# Patient Record
Sex: Female | Born: 1985 | Race: Black or African American | Hispanic: No | Marital: Married | Smoking: Never smoker
Health system: Southern US, Community
[De-identification: ages and names within clinical notes are randomized; demographics above are authoritative.]

## PROBLEM LIST (undated history)

## (undated) ENCOUNTER — Inpatient Hospital Stay: Payer: Self-pay

## (undated) DIAGNOSIS — F419 Anxiety disorder, unspecified: Secondary | ICD-10-CM

## (undated) DIAGNOSIS — F32A Depression, unspecified: Secondary | ICD-10-CM

## (undated) DIAGNOSIS — K5900 Constipation, unspecified: Secondary | ICD-10-CM

## (undated) DIAGNOSIS — R61 Generalized hyperhidrosis: Secondary | ICD-10-CM

## (undated) DIAGNOSIS — Z789 Other specified health status: Secondary | ICD-10-CM

## (undated) HISTORY — PX: NO PAST SURGERIES: SHX2092

## (undated) HISTORY — DX: Anxiety disorder, unspecified: F41.9

## (undated) HISTORY — DX: Constipation, unspecified: K59.00

## (undated) HISTORY — DX: Generalized hyperhidrosis: R61

---

## 2013-12-08 LAB — OB RESULTS CONSOLE RPR
RPR: NONREACTIVE
RPR: NONREACTIVE
RPR: NONREACTIVE

## 2013-12-08 LAB — OB RESULTS CONSOLE ABO/RH: RH TYPE: POSITIVE

## 2013-12-08 LAB — OB RESULTS CONSOLE HIV ANTIBODY (ROUTINE TESTING): HIV: NONREACTIVE

## 2013-12-08 LAB — OB RESULTS CONSOLE GC/CHLAMYDIA
Chlamydia: NEGATIVE
GC PROBE AMP, GENITAL: NEGATIVE

## 2013-12-08 LAB — OB RESULTS CONSOLE RUBELLA ANTIBODY, IGM: RUBELLA: IMMUNE

## 2013-12-08 LAB — OB RESULTS CONSOLE VARICELLA ZOSTER ANTIBODY, IGG: Varicella: IMMUNE

## 2013-12-08 LAB — OB RESULTS CONSOLE HEPATITIS B SURFACE ANTIGEN: HEP B S AG: NEGATIVE

## 2014-01-15 NOTE — L&D Delivery Note (Signed)
Delivery Note At  a viable female infant Vaginal, Spontaneous Delivery (Presentation: Left Occiput Anterior).  APGAR: 7, 9; weight 8 lb 4.9 oz (3768 g).   Placenta status: Intact, Spontaneous.  Cord: 3 vessels with the following complications: .  Cord pH: N/A  Anesthesia: Epidural  Episiotomy: None Lacerations: Vaginal Suture Repair: 2.0 vicryl Est. Blood Loss (mL): 600  Quick second stage. Meconium stained amniotic fluid noted after delivery of head. <30 second shoulder dystocia resolved with McRoberts and suprapubic pressure. Infant not initially vigorous, cord clamped and cut immediately, to warmer for assessment by nursery staff.    Baby's Name: Kelly Kelly  Mom to postpartum.  Baby to Couplet care / Skin to Skin.  Marta AntuBrothers, Dalani Mette 07/17/2014, 7:19 PM

## 2014-06-21 LAB — OB RESULTS CONSOLE GBS: GBS: POSITIVE

## 2014-07-17 ENCOUNTER — Encounter: Payer: Self-pay | Admitting: *Deleted

## 2014-07-17 ENCOUNTER — Inpatient Hospital Stay: Payer: Managed Care, Other (non HMO) | Admitting: Anesthesiology

## 2014-07-17 ENCOUNTER — Inpatient Hospital Stay
Admission: EM | Admit: 2014-07-17 | Discharge: 2014-07-19 | DRG: 775 | Disposition: A | Discharge status: Home / Self Care | Payer: Managed Care, Other (non HMO) | Attending: Obstetrics and Gynecology | Admitting: Obstetrics and Gynecology

## 2014-07-17 DIAGNOSIS — O99824 Streptococcus B carrier state complicating childbirth: Secondary | ICD-10-CM | POA: Diagnosis present

## 2014-07-17 DIAGNOSIS — Z3A39 39 weeks gestation of pregnancy: Secondary | ICD-10-CM | POA: Diagnosis present

## 2014-07-17 HISTORY — DX: Other specified health status: Z78.9

## 2014-07-17 LAB — COMPREHENSIVE METABOLIC PANEL
ALT: 14 U/L (ref 14–54)
AST: 23 U/L (ref 15–41)
Albumin: 3 g/dL — ABNORMAL LOW (ref 3.5–5.0)
Alkaline Phosphatase: 199 U/L — ABNORMAL HIGH (ref 38–126)
Anion gap: 8 (ref 5–15)
BUN: 12 mg/dL (ref 6–20)
CHLORIDE: 107 mmol/L (ref 101–111)
CO2: 20 mmol/L — ABNORMAL LOW (ref 22–32)
Calcium: 8.7 mg/dL — ABNORMAL LOW (ref 8.9–10.3)
Creatinine, Ser: 0.84 mg/dL (ref 0.44–1.00)
GFR calc Af Amer: >60 mL/min (ref >60)
GFR calc non Af Amer: >60 mL/min (ref >60)
GLUCOSE: 86 mg/dL (ref 65–99)
POTASSIUM: 4.1 mmol/L (ref 3.5–5.1)
Sodium: 135 mmol/L (ref 135–145)
Total Bilirubin: 0.3 mg/dL (ref 0.3–1.2)
Total Protein: 7.4 g/dL (ref 6.5–8.1)

## 2014-07-17 LAB — CBC
HEMATOCRIT: 40.8 % (ref 35.0–47.0)
HEMOGLOBIN: 13.3 g/dL (ref 12.0–16.0)
MCH: 30.3 pg (ref 26.0–34.0)
MCHC: 32.6 g/dL (ref 32.0–36.0)
MCV: 93 fL (ref 80.0–100.0)
PLATELETS: 228 10*3/uL (ref 150–440)
RBC: 4.39 MIL/uL (ref 3.80–5.20)
RDW: 13.9 % (ref 11.5–14.5)
WBC: 14.1 10*3/uL — ABNORMAL HIGH (ref 3.6–11.0)

## 2014-07-17 LAB — SAMPLE TO BLOOD BANK

## 2014-07-17 LAB — PROTEIN / CREATININE RATIO, URINE
Creatinine, Urine: 97 mg/dL
PROTEIN CREATININE RATIO: 0.16 mg/mg{creat} — AB (ref 0.00–0.15)
TOTAL PROTEIN, URINE: 16 mg/dL

## 2014-07-17 LAB — URIC ACID: URIC ACID, SERUM: 7.1 mg/dL — AB (ref 2.3–6.6)

## 2014-07-17 MED ORDER — ONDANSETRON HCL 4 MG PO TABS: 4.0000 mg | ORAL_TABLET | ORAL | Status: DC | PRN

## 2014-07-17 MED ORDER — ACETAMINOPHEN 325 MG PO TABS: 650.0000 mg | ORAL_TABLET | ORAL | Status: DC | PRN

## 2014-07-17 MED ORDER — ZOLPIDEM TARTRATE 5 MG PO TABS: 5.0000 mg | ORAL_TABLET | Freq: Every evening | ORAL | Status: DC | PRN

## 2014-07-17 MED ORDER — LACTATED RINGERS IV SOLN
INTRAVENOUS | Status: DC
  Administered 2014-07-17 (×2): via INTRAVENOUS

## 2014-07-17 MED ORDER — MISOPROSTOL 200 MCG PO TABS
ORAL_TABLET | ORAL | Status: AC
  Filled 2014-07-17: qty 4

## 2014-07-17 MED ORDER — EPHEDRINE 5 MG/ML INJ
10.0000 mg | INTRAVENOUS | Status: DC | PRN
  Filled 2014-07-17: qty 2

## 2014-07-17 MED ORDER — DIPHENHYDRAMINE HCL 50 MG/ML IJ SOLN: 12.5000 mg | INTRAMUSCULAR | Status: DC | PRN

## 2014-07-17 MED ORDER — OXYTOCIN 40 UNITS IN LACTATED RINGERS INFUSION - SIMPLE MED: 62.5000 mL/h | INTRAVENOUS | Status: DC

## 2014-07-17 MED ORDER — SODIUM CHLORIDE 0.9 % IV SOLN
2.0000 g | Freq: Once | INTRAVENOUS | Status: AC
  Administered 2014-07-17: 2 g via INTRAVENOUS

## 2014-07-17 MED ORDER — LANOLIN HYDROUS EX OINT: TOPICAL_OINTMENT | CUTANEOUS | Status: DC | PRN

## 2014-07-17 MED ORDER — FERROUS SULFATE 325 (65 FE) MG PO TABS
325.0000 mg | ORAL_TABLET | Freq: Every day | ORAL | Status: DC
  Administered 2014-07-18 – 2014-07-19 (×2): 325 mg via ORAL
  Filled 2014-07-17 (×2): qty 1

## 2014-07-17 MED ORDER — ONDANSETRON HCL 4 MG/2ML IJ SOLN
INTRAMUSCULAR | Status: AC
  Administered 2014-07-17: 4 mg via INTRAVENOUS
  Filled 2014-07-17: qty 2

## 2014-07-17 MED ORDER — DOCUSATE SODIUM 100 MG PO CAPS
100.0000 mg | ORAL_CAPSULE | Freq: Two times a day (BID) | ORAL | Status: DC
  Administered 2014-07-17 – 2014-07-19 (×4): 100 mg via ORAL
  Filled 2014-07-17 (×4): qty 1

## 2014-07-17 MED ORDER — IBUPROFEN 600 MG PO TABS
600.0000 mg | ORAL_TABLET | Freq: Four times a day (QID) | ORAL | Status: DC
  Administered 2014-07-17 – 2014-07-19 (×7): 600 mg via ORAL
  Filled 2014-07-17 (×7): qty 1

## 2014-07-17 MED ORDER — CALCIUM CARBONATE ANTACID 500 MG PO CHEW
CHEWABLE_TABLET | ORAL | Status: AC
  Filled 2014-07-17: qty 1

## 2014-07-17 MED ORDER — SODIUM CHLORIDE 0.9 % IV SOLN
1.0000 g | INTRAVENOUS | Status: DC
  Administered 2014-07-17 (×2): 1 g via INTRAVENOUS
  Filled 2014-07-17 (×4): qty 1000

## 2014-07-17 MED ORDER — LIDOCAINE HCL (PF) 1 % IJ SOLN
INTRAMUSCULAR | Status: AC
  Filled 2014-07-17: qty 30

## 2014-07-17 MED ORDER — DIBUCAINE 1 % RE OINT: 1.0000 "application " | TOPICAL_OINTMENT | RECTAL | Status: DC | PRN

## 2014-07-17 MED ORDER — FENTANYL 2.5 MCG/ML W/ROPIVACAINE 0.2% IN NS 100 ML EPIDURAL INFUSION (ARMC-ANES): 10.0000 mL/h | EPIDURAL | Status: DC

## 2014-07-17 MED ORDER — LACTATED RINGERS IV SOLN: INTRAVENOUS | Status: DC

## 2014-07-17 MED ORDER — BUPIVACAINE HCL (PF) 0.25 % IJ SOLN
INTRAMUSCULAR | Status: DC | PRN
  Administered 2014-07-17: 10 mL

## 2014-07-17 MED ORDER — OXYCODONE-ACETAMINOPHEN 5-325 MG PO TABS: 2.0000 | ORAL_TABLET | ORAL | Status: DC | PRN

## 2014-07-17 MED ORDER — SODIUM CHLORIDE 0.9 % IV SOLN
INTRAVENOUS | Status: AC
  Administered 2014-07-17: 1 g via INTRAVENOUS
  Filled 2014-07-17: qty 1000

## 2014-07-17 MED ORDER — LIDOCAINE-EPINEPHRINE (PF) 1.5 %-1:200000 IJ SOLN
INTRAMUSCULAR | Status: DC | PRN
  Administered 2014-07-17: 3 mL via PERINEURAL

## 2014-07-17 MED ORDER — AMMONIA AROMATIC IN INHA
RESPIRATORY_TRACT | Status: AC
  Filled 2014-07-17: qty 10

## 2014-07-17 MED ORDER — SIMETHICONE 80 MG PO CHEW: 80.0000 mg | CHEWABLE_TABLET | ORAL | Status: DC | PRN

## 2014-07-17 MED ORDER — OXYTOCIN 10 UNIT/ML IJ SOLN
INTRAMUSCULAR | Status: AC
  Filled 2014-07-17: qty 2

## 2014-07-17 MED ORDER — WITCH HAZEL-GLYCERIN EX PADS: 1.0000 "application " | MEDICATED_PAD | CUTANEOUS | Status: DC | PRN

## 2014-07-17 MED ORDER — LACTATED RINGERS IV SOLN
500.0000 mL | INTRAVENOUS | Status: DC | PRN
  Administered 2014-07-17: 500 mL via INTRAVENOUS

## 2014-07-17 MED ORDER — OXYCODONE-ACETAMINOPHEN 5-325 MG PO TABS: 1.0000 | ORAL_TABLET | ORAL | Status: DC | PRN

## 2014-07-17 MED ORDER — OXYTOCIN 40 UNITS IN LACTATED RINGERS INFUSION - SIMPLE MED
INTRAVENOUS | Status: AC
  Filled 2014-07-17: qty 1000

## 2014-07-17 MED ORDER — PHENYLEPHRINE 40 MCG/ML (10ML) SYRINGE FOR IV PUSH (FOR BLOOD PRESSURE SUPPORT)
80.0000 ug | PREFILLED_SYRINGE | INTRAVENOUS | Status: DC | PRN
  Filled 2014-07-17: qty 2

## 2014-07-17 MED ORDER — PRENATAL MULTIVITAMIN CH
1.0000 | ORAL_TABLET | Freq: Every day | ORAL | Status: DC
  Administered 2014-07-18 – 2014-07-19 (×2): 1 via ORAL
  Filled 2014-07-17 (×2): qty 1

## 2014-07-17 MED ORDER — BUTORPHANOL TARTRATE 1 MG/ML IJ SOLN
2.0000 mg | Freq: Once | INTRAMUSCULAR | Status: AC | PRN
  Administered 2014-07-17: 2 mg via INTRAVENOUS

## 2014-07-17 MED ORDER — DIPHENHYDRAMINE HCL 25 MG PO CAPS: 25.0000 mg | ORAL_CAPSULE | Freq: Four times a day (QID) | ORAL | Status: DC | PRN

## 2014-07-17 MED ORDER — SODIUM CHLORIDE 0.9 % IV SOLN
INTRAVENOUS | Status: AC
  Administered 2014-07-17: 2 g via INTRAVENOUS
  Filled 2014-07-17: qty 2000

## 2014-07-17 MED ORDER — ONDANSETRON HCL 4 MG/2ML IJ SOLN
4.0000 mg | Freq: Four times a day (QID) | INTRAMUSCULAR | Status: DC | PRN
  Administered 2014-07-17: 4 mg via INTRAVENOUS

## 2014-07-17 MED ORDER — BENZOCAINE-MENTHOL 20-0.5 % EX AERO: 1.0000 "application " | INHALATION_SPRAY | CUTANEOUS | Status: DC | PRN

## 2014-07-17 MED ORDER — FENTANYL 2.5 MCG/ML W/ROPIVACAINE 0.2% IN NS 100 ML EPIDURAL INFUSION (ARMC-ANES)
EPIDURAL | Status: DC | PRN
  Administered 2014-07-17: 10 mL/h via EPIDURAL

## 2014-07-17 MED ORDER — FENTANYL 2.5 MCG/ML W/ROPIVACAINE 0.2% IN NS 100 ML EPIDURAL INFUSION (ARMC-ANES)
EPIDURAL | Status: AC
  Administered 2014-07-17: 250 ug via EPIDURAL
  Filled 2014-07-17: qty 100

## 2014-07-17 MED ORDER — OXYTOCIN BOLUS FROM INFUSION
500.0000 mL | INTRAVENOUS | Status: DC
  Administered 2014-07-17: 500 mL via INTRAVENOUS

## 2014-07-17 MED ORDER — ONDANSETRON HCL 4 MG/2ML IJ SOLN: 4.0000 mg | INTRAMUSCULAR | Status: DC | PRN

## 2014-07-17 MED ORDER — BUTORPHANOL TARTRATE 1 MG/ML IJ SOLN
INTRAMUSCULAR | Status: AC
  Administered 2014-07-17: 2 mg via INTRAVENOUS
  Filled 2014-07-17: qty 2

## 2014-07-17 NOTE — Anesthesia Preprocedure Evaluation (Signed)
Anesthesia Evaluation  Patient identified by MRN, date of birth, ID band Patient awake    Reviewed: Allergy & Precautions, NPO status , Patient's Chart, lab work & pertinent test results  Airway Mallampati: II  TM Distance: >3 FB Neck ROM: Full    Dental no notable dental hx.    Pulmonary neg pulmonary ROS,  breath sounds clear to auscultation  Pulmonary exam normal       Cardiovascular negative cardio ROS Normal cardiovascular examRhythm:Regular     Neuro/Psych negative neurological ROS  negative psych ROS   GI/Hepatic negative GI ROS, Neg liver ROS,   Endo/Other  negative endocrine ROS  Renal/GU negative Renal ROS  negative genitourinary   Musculoskeletal negative musculoskeletal ROS (+)   Abdominal Normal abdominal exam  (+)   Peds negative pediatric ROS (+)  Hematology negative hematology ROS (+)   Anesthesia Other Findings   Reproductive/Obstetrics (+) Pregnancy                             Anesthesia Physical Anesthesia Plan  ASA: II  Anesthesia Plan: Epidural   Post-op Pain Management:    Induction:   Airway Management Planned: Natural Airway  Additional Equipment:   Intra-op Plan:   Post-operative Plan:   Informed Consent: I have reviewed the patients History and Physical, chart, labs and discussed the procedure including the risks, benefits and alternatives for the proposed anesthesia with the patient or authorized representative who has indicated his/her understanding and acceptance.   Dental advisory given  Plan Discussed with: Surgeon  Anesthesia Plan Comments:         Anesthesia Quick Evaluation

## 2014-07-17 NOTE — Progress Notes (Signed)
S: Pt resting comfortably with epidural O: cervix 9.5/100/0 A: IUP at term, GHTN P: Attempting to push with contraction x 1, cervix did not reduce. HOB elevated, will recheck in about 30 minutes Anticipate vaginal delivery

## 2014-07-17 NOTE — Anesthesia Procedure Notes (Signed)
Epidural Patient location during procedure: OB Start time: 07/17/2014 11:05 AM End time: 07/17/2014 11:13 AM  Staffing Anesthesiologist: Yves DillARROLL, Garek Schuneman Performed by: anesthesiologist   Preanesthetic Checklist Completed: patient identified, site marked, surgical consent, pre-op evaluation, timeout performed, IV checked, risks and benefits discussed and monitors and equipment checked  Epidural Patient position: sitting Prep: Betadine Patient monitoring: heart rate, cardiac monitor, continuous pulse ox and blood pressure Approach: midline Location: L3-L4 Injection technique: LOR air  Needle:  Needle type: Tuohy  Needle gauge: 18 G Needle length: 9 cm Catheter type: closed end flexible Catheter size: 20 Guage  Assessment Sensory level: T8  Additional Notes Easy Epidural under sterile prep and drape.  Neg test dose with catheter threaded 4cm  Pt tolerated procedure wellReason for block:procedure for pain

## 2014-07-17 NOTE — Progress Notes (Signed)
L&D Note  07/17/2014 - 11:53 AM  29 y.o. G2P0010 7236w4d   [name] is admitted for labor   Subjective:  Feeling comfortable after epidural  Objective:   Filed Vitals:   07/17/14 1121 07/17/14 1125 07/17/14 1130 07/17/14 1135  BP: 123/60     Pulse: 78     Temp:   97.3 F (36.3 C)   TempSrc:   Oral   Resp:   18   Height:      Weight:      SpO2: 99% 98% 98% 97%    Current Vital Signs 24h Vital Sign Ranges  T 97.3 F (36.3 C) Temp  Avg: 97.9 F (36.6 C)  Min: 97.3 F (36.3 C)  Max: 98.4 F (36.9 C)  BP 123/60 mmHg BP  Min: 123/60  Max: 164/92  HR 78 Pulse  Avg: 90.8  Min: 78  Max: 109  RR 18 Resp  Avg: 19.3  Min: 18  Max: 20  SaO2 97 %   SpO2  Avg: 98.2 %  Min: 97 %  Max: 99 %       24 Hour I/O Current Shift I/O  Time Ins Outs        FHR: category 1 tracing, baseline 130-140, mod variability, + accels Toco: q 1-3 min SVE: 7/100/-1   Assessment :  IUP at term, labor    Plan:  AROM with clear fluid noted.  Anticipate vaginal delivery soon HTN: Awaiting PC Ratio, otherwise labs WNL except Uric Acid 7.2, BP stable since epidural  Kelly Kelly, Kelly Kelly, [place]

## 2014-07-17 NOTE — H&P (Signed)
Obstetric History and Physical  Kelly Kelly is a 29 y.o. G2P0010 with Estimated Date of Delivery: 07/20/14 per LMP and 9 wk US who presents at 516w4d  presenting for active labor. Patient states she has been having contractions since last night, bloody show, intact membranes, with active fetal movement.  Denies ha, visual changes or edema.   Prenatal Course Source of Care: [facility]  with onset of care at 8 weeks Pregnancy complications or risks: Patient Active Problem List   Diagnosis Date Noted  . Labor and delivery, indication for care 07/17/2014   She plans to breastfeed She desires undecided for postpartum contraception.   Prenatal labs and studies: ABO, Rh: A+  Antibody: neg Rubella: Immune Varicella: Immune RPR:  NR HBsAg:  neg HIV: neg GC/CT: neg/neg GBS: positive 1 hr Glucola: 91   Genetic screening: neg 1st trimester    Prenatal Transfer Tool   Past Medical History  Diagnosis Date  . Medical history non-contributory     Past Surgical History  Procedure Laterality Date  . No past surgeries      OB History  Gravida Para Term Preterm AB SAB TAB Ectopic Multiple Living  2 0 0 0 1     0    # Outcome Date GA Lbr Len/2nd Weight Sex Delivery Anes PTL Lv  2 Current           1 AB               History   Social History  . Marital Status: N/A    Spouse Name: N/A  . Number of Children: N/A  . Years of Education: N/A   Social History Main Topics  . Smoking status: Never Smoker   . Smokeless tobacco: Never Used  . Alcohol Use: No  . Drug Use: No  . Sexual Activity: Yes   Other Topics Concern  . None   Social History Narrative  . None    History reviewed. No pertinent family history.  Prescriptions prior to admission  Medication Sig Dispense Refill Last Dose  . Prenatal Vit-Fe Fumarate-FA (PRENATAL PO) Take 1 tablet by mouth daily.   07/16/2014 at Unknown time    No Known Allergies  Review of Systems: Negative except for what is mentioned in  HPI.  Physical Exam: BP 140/101 mmHg  Pulse 109  Temp(Src) 98.4 F (36.9 C) (Oral)  Resp 20  Ht 5\' 7"  (1.702 m)  Wt 200 lb (90.719 kg)  BMI 31.32 kg/m2  LMP 10/13/2013, BPs  Since arrival: 164/92, 142/96, 151/92, 140/101 GENERAL: Well-developed, well-nourished female in no acute distress.  LUNGS: Clear to auscultation bilaterally.  HEART: Regular rate and rhythm. ABDOMEN: Soft, nontender, nondistended, gravid. EXTREMITIES: Nontender, no edema Cervical Exam: Dilatation 4 cm   Effacement 100 %   Station -2, per RN   Presentation: cephalic FHT: Category: Baseline rate 135 bpm   Variability moderate  Accelerations present   Decelerations none Contractions: Every 1-2 mins   Pertinent Labs/Studies:   No results found for this or any previous visit (from the past 24 hour(s)).  Assessment : IUP at 9416w4d active labor, GHTN vs pre-eclampsia  Plan: Admit for labor and Antibiotics for GBS prophylaxis  Requesting epidural - stadol until labs returned Pre-eclampsia labs pending - will follow

## 2014-07-17 NOTE — Discharge Summary (Signed)
Obstetrical Discharge Summary  Date of Admission: 07/17/2014 Date of Discharge: 07/19/14  Primary OB: [facility]  Gestational Age at Delivery: 4716w4d   Antepartum complications: none Reason for Admission: Labor Date of Delivery: 07/17/14  Delivered By: Donnalee CurryKB, CNM Delivery Type: spontaneous vaginal delivery Intrapartum complications/course: Shoulder Dystocia - mild, < 30 seconds Anesthesia: epidural Placenta: spontaneous Laceration: vaginal  Episiotomy: none Baby: Liveborn female, APGARs7/9, weight 3765 g.   VS: BP 122/72 mmHg  Pulse 64  Temp(Src) 98.6 F (37 C) (Oral)  Resp 20   SpO2 99%  Physical exam: Gen: NAD, comfortable breastfeeding baby CV: RRR Pulm: CTABL, normal effort Abd: fundus firm below umbilicus, nt/nd/s Ext: no ttp, no edema  Post partum course: routine, uneventful.   Disposition: Home with baby  Rh Immune globulin given: no Rubella vaccine given: no Tdap vaccine given in AP or PP setting: yes Flu vaccine given in AP or PP setting: unk  Contraception: minipill prescribed  Prenatal/Postnatal Panel: A//Rubella Immune/Varicella Immune/RPR non-reactive//HIV negative/HepB Surface Ag negative//plans to breastfeed  Plan:  Kelly Kelly was discharged to home in good condition. Follow-up appointment with Tammy Brothers in 6 weeks.   Discharge Medications:   Medication List    TAKE these medications        acetaminophen 325 MG tablet  Commonly known as:  TYLENOL  Take 2 tablets (650 mg total) by mouth every 4 (four) hours as needed (for pain scale < 4).     docusate sodium 100 MG capsule  Commonly known as:  COLACE  Take 1 capsule (100 mg total) by mouth 2 (two) times daily.     ibuprofen 600 MG tablet  Commonly known as:  ADVIL,MOTRIN  Take 1 tablet (600 mg total) by mouth every 6 (six) hours.     norethindrone 0.35 MG tablet  Commonly known as:  ORTHO MICRONOR  Take 1 tablet (0.35 mg total) by mouth daily. Take at the same time every day.     PRENATAL PO  Take 1 tablet by mouth daily.       Signed: Ranae Plumberhelsea Ward, MD Attending Obstetrician and Gynecologist [facility]

## 2014-07-18 LAB — HEMOGLOBIN AND HEMATOCRIT, BLOOD
HCT: 36.4 % (ref 35.0–47.0)
HEMOGLOBIN: 12.2 g/dL (ref 12.0–16.0)

## 2014-07-18 LAB — RPR: RPR Ser Ql: NONREACTIVE

## 2014-07-18 NOTE — Anesthesia Postprocedure Evaluation (Signed)
  Anesthesia Post-op Note  Patient: [name]  Procedure(s) Performed: * No procedures listed *  Anesthesia type:Epidural  Patient location: PACU  Post pain: Pain level controlled  Post assessment: Post-op Vital signs reviewed, Patient's Cardiovascular Status Stable, Respiratory Function Stable, Patent Airway and No signs of Nausea or vomiting  Post vital signs: Reviewed and stable  Last Vitals:  Filed Vitals:   07/18/14 0745  BP: 124/70  Pulse: 74  Temp: 36.9 C  Resp: 18    Level of consciousness: awake, alert  and patient cooperative  Complications: No apparent anesthesia complications

## 2014-07-18 NOTE — Progress Notes (Signed)
  Postpartum Day 1  Subjective: no complaints and up ad lib  Objective: Blood pressure 133/77, pulse 81, temperature 98.3 F (36.8 C), temperature source Oral, resp. rate 18, height 5\' 7"  (1.702 m), weight 200 lb (90.719 kg), last menstrual period 10/13/2013, SpO2 98 %, unknown if currently breastfeeding.  Physical Exam:  General: alert and cooperative Lochia: appropriate Uterine Fundus: firm Incision: N/A DVT Evaluation: No evidence of DVT seen on physical exam. Abdomen: soft, NT   Recent Labs  07/17/14 0844 07/18/14 0419  HGB 13.3 12.2  HCT 40.8 36.4    Assessment PPD #1  Plan: Discharge tomorrow and Continue PP care  Feeding: breast Contraception: probably minipill? RI/VI TDAP up to date   Marta AntuBrothers, Fia Hebert, [place] 07/18/2014, 1:13 PM

## 2014-07-18 NOTE — Progress Notes (Signed)
   07/18/14 1000  Clinical Encounter Type  Visited With Patient  Visit Type Initial  Referral From Nurse  Spiritual Encounters  Spiritual Needs Prayer;Literature  Stress Factors  Patient Stress Factors Major life changes  Family Stress Factors Other (Comment)  Advance Directives (For Healthcare)  Does patient have an advance directive? No  Would patient like information on creating an advanced directive? Yes - Educational materials given  Mariea ClontsChaplain Kenzlie Disch engaged patient and family and gave AD education and offered prayer and support. Chaplain will follow up and gave encouragement to family. Chaplain Falisha Osment

## 2014-07-19 MED ORDER — IBUPROFEN 600 MG PO TABS: 600.0000 mg | ORAL_TABLET | Freq: Four times a day (QID) | ORAL | Status: DC

## 2014-07-19 MED ORDER — DOCUSATE SODIUM 100 MG PO CAPS: 100.0000 mg | ORAL_CAPSULE | Freq: Two times a day (BID) | ORAL | Status: DC

## 2014-07-19 MED ORDER — ACETAMINOPHEN 325 MG PO TABS: 650.0000 mg | ORAL_TABLET | ORAL | Status: DC | PRN

## 2014-07-19 MED ORDER — NORETHINDRONE 0.35 MG PO TABS: 1.0000 | ORAL_TABLET | Freq: Every day | ORAL | Status: DC

## 2014-07-19 NOTE — Discharge Instructions (Signed)
You will experience some cramping, take ibuprofen or tylenol for this every 6 hours as needed.   You have been prescribed the "minipill" which is progesterone-only - it is very important that you take this pill at the same time every day.  If not, it will not work, and may result in pregnancy if you have sex.  Please call the office with any concerns, and we'll see you in 6 weeks.

## 2014-07-19 NOTE — Progress Notes (Signed)
Patient discharged home with infant. Discharge instructions, prescriptions and follow up appointment given to and reviewed with patient. Patient verbalized understanding. Escorted out by B[employer]on, Dickinson and CompanySandy Williams via wheelchair.

## 2016-01-16 NOTE — L&D Delivery Note (Signed)
        Delivery Note   Kelly Kelly is a 31 y.o. G3P1011 at 1760w2d Estimated Date of Delivery: 08/02/16  PRE-OPERATIVE DIAGNOSIS:  1) 1860w2d pregnancy.   POST-OPERATIVE DIAGNOSIS:  1) 360w2d pregnancy s/p Vaginal, Spontaneous Delivery   Delivery Type: Vaginal, Spontaneous Delivery    Delivery Clinician: Linzie CollinEVANS, Aniela Caniglia JAMES   Delivery Anesthesia: Epidural   Labor Complications:     Additional complications: none  ESTIMATED BLOOD LOSS: 100  ml    FINDINGS:   1) female infant, Apgar scores of 8    at 1 minute and 8    at 5 minutes and a birthweight of 152.38  ounces.    2) Nuchal cord: No  SPECIMENS:   PLACENTA:   Appearance: Intact    Removal: Spontaneous      Disposition:     DISPOSITION:  Infant to left in stable condition in the delivery room, with L&D personnel and mother,  NARRATIVE SUMMARY: Labor course:  [name] is a G3P1011 at 7960w2d who presented for labor management.  She progressed well in labor without pitocin.  She received the appropriate anesthesia and proceeded to complete dilation. She evidenced good maternal expulsive effort during the second stage. She went on to deliver a viable infant. The placenta delivered without problems and was noted to be complete. A perineal and vaginal examination was performed. Episiotomy/Lacerations: None    Elonda Huskyavid J. Jozie Wulf, M.D. 08/11/2016 10:18 AM

## 2016-02-10 ENCOUNTER — Encounter: Payer: Self-pay | Admitting: Obstetrics and Gynecology

## 2016-02-16 ENCOUNTER — Encounter: Payer: Self-pay | Admitting: Obstetrics and Gynecology

## 2016-02-16 ENCOUNTER — Ambulatory Visit (INDEPENDENT_AMBULATORY_CARE_PROVIDER_SITE_OTHER): Payer: BC Managed Care – PPO | Admitting: Obstetrics and Gynecology

## 2016-02-16 VITALS — BP 132/88 | HR 96 | Wt 193.1 lb

## 2016-02-16 DIAGNOSIS — Z23 Encounter for immunization: Secondary | ICD-10-CM | POA: Diagnosis not present

## 2016-02-16 DIAGNOSIS — Z3492 Encounter for supervision of normal pregnancy, unspecified, second trimester: Secondary | ICD-10-CM

## 2016-02-16 DIAGNOSIS — B9689 Other specified bacterial agents as the cause of diseases classified elsewhere: Secondary | ICD-10-CM

## 2016-02-16 DIAGNOSIS — N760 Acute vaginitis: Secondary | ICD-10-CM | POA: Diagnosis not present

## 2016-02-16 LAB — POCT URINALYSIS DIPSTICK
BILIRUBIN UA: NEGATIVE
Glucose, UA: NEGATIVE
Ketones, UA: NEGATIVE
Leukocytes, UA: NEGATIVE
Nitrite, UA: NEGATIVE
PH UA: 5
Protein, UA: NEGATIVE
RBC UA: NEGATIVE
Spec Grav, UA: 1.01
UROBILINOGEN UA: NEGATIVE

## 2016-02-16 MED ORDER — METRONIDAZOLE 500 MG PO TABS: 500.0000 mg | ORAL_TABLET | Freq: Two times a day (BID) | ORAL | 0 refills | Status: AC

## 2016-02-16 NOTE — Progress Notes (Signed)
New pt is a 31 yo G2 P1 0 1 1. [facility].LMP 10/27/15. Vomiting x2, breast tenderness and fatigue.Pt states she has BV and lost the script she was given to treat it.

## 2016-02-16 NOTE — Progress Notes (Signed)
HPI:      [name] is a 31 y.o. G3P1011 who LMP was Patient's last menstrual period was 10/27/2015 (exact date).  Subjective: She presents today as a transfer from [facility]. She is partially 16 weeks estimated gestational age from last menstrual period and early ultrasound. Her first pregnancy delivered vaginally at term with a mild shoulder dystocia present. Baby weighed 8 lbs. 5 oz. She was diagnosed with BV at her office visit approximate 5 weeks ago but did not pick up her prescription. She continues to complain of vaginal discharge with odor. She would like to be checked.    Hx: The following portions of the patient's history were reviewed and updated as appropriate:                  ROS: Constitutional: Denied constitutional symptoms, night sweats, recent illness, fatigue, fever, insomnia and weight loss.  Eyes: Denied eye symptoms, eye pain, photophobia, vision change and visual disturbance.  Ears/Nose/Throat/Neck: Denied ear, nose, throat or neck symptoms, hearing loss, nasal discharge, sinus congestion and sore throat.  Cardiovascular: Denied cardiovascular symptoms, arrhythmia, chest pain/pressure, edema, exercise intolerance, orthopnea and palpitations.  Respiratory: Denied pulmonary symptoms, asthma, pleuritic pain, productive sputum, cough, dyspnea and wheezing.  Gastrointestinal: Denied, gastro-esophageal reflux, melena, nausea and vomiting.  Genitourinary: See HPI for additional information.  Musculoskeletal: Denied musculoskeletal symptoms, stiffness, swelling, muscle weakness and myalgia.  Dermatologic: Denied dermatology symptoms, rash and scar.  Neurologic: Denied neurology symptoms, dizziness, headache, neck pain and syncope.  Psychiatric: Denied psychiatric symptoms, anxiety and depression.  Endocrine: Denied endocrine symptoms including hot flashes and night sweats.   Meds: She has a current medication list which includes the following prescription(s):  prenatal vit-fe fumarate-fa and metronidazole.  Objective: Vitals:   02/16/16 0908  BP: 132/88  Pulse: 96   Physical examination General NAD, Conversant  HEENT Atraumatic; Op clear with mmm.  Normo-cephalic. Pupils reactive. Anicteric sclerae  Thyroid/Neck Smooth without nodularity or enlargement. Normal ROM.  Neck Supple.  Skin No rashes, lesions or ulceration. Normal palpated skin turgor. No nodularity.  Breasts: No masses or discharge.  Symmetric.  No axillary adenopathy.  Lungs: Clear to auscultation.No rales or wheezes. Normal Respiratory effort, no retractions.  Heart: NSR.  No murmurs or rubs appreciated. No periferal edema  Abdomen: Soft.  Non-tender.  No masses.  No HSM. No hernia  Extremities: Moves all appropriately.  Normal ROM for age. No lymphadenopathy.  Neuro: Oriented to PPT.  Normal mood. Normal affect.     Pelvic:   Vulva: Normal appearance.  No lesions.  Vagina: No lesions or abnormalities noted.  Support: Normal pelvic support.  Urethra No masses tenderness or scarring.  Meatus Normal size without lesions or prolapse.  Cervix: Normal appearance.  No lesions.  Anus: Normal exam.  No lesions.  Perineum: Normal exam.  No lesions.        Bimanual   Adnexae: No masses.  Non-tender to palpation.  Uterus: Enlarged. 17 wks  Non-tender.  Mobile.  AV.  Adnexae: No masses.  Non-tender to palpation.  Cul-de-sac: Negative for abnormality.  Adnexae: No masses.  Non-tender to palpation.         Pelvimetry   Diagonal: Reached.  Spines: Average.  Sacrum: Concave.  Pubic Arch: Normal.      Assessment: 1. Prenatal care in second trimester   2. BV (bacterial vaginosis)   3. Flu vaccine need    WET PREP: bacteria: 3+, clue cells: present, KOH (yeast): negative, odor: present,  trichomoniasis: negative and WBC: 2+ Ph:  > 4.5   Plan:            1.  Continue prenatal care. Quad screen today  2.  FAS 18 weeks.  3.  Treat for BV.  Orders Orders Placed This  Encounter  Procedures  . US OB Comp + 14 Wk  . Flu Vaccine QUAD 36+ mos IM  . AFP, Quad Screen  . POCT urinalysis dipstick     Meds ordered this encounter  Medications  . metroNIDAZOLE (FLAGYL) 500 MG tablet    Sig: Take 1 tablet (500 mg total) by mouth 2 (two) times daily.    Dispense:  14 tablet    Refill:  0        F/U  No Follow-up on file.  Elonda Huskyavid J. Evans, M.D. 02/16/2016 10:12 AM

## 2016-02-23 ENCOUNTER — Telehealth: Payer: Self-pay

## 2016-02-23 LAB — AFP, QUAD SCREEN
DIA Mom Value: 0.9
DIA VALUE (EIA): 143.96 pg/mL
DSR (By Age)    1 IN: 757
DSR (Second Trimester) 1 IN: 10000
GESTATIONAL AGE AFP: 16.4 wk
MSAFP Mom: 0.51
MSAFP: 17.6 ng/mL
MSHCG Mom: 0.33
MSHCG: 10607 m[IU]/mL
Maternal Age At EDD: 29.3 YEARS
Osb Risk: 10000
T18 (By Age): 1:2949 {titer}
TEST RESULTS AFP: NEGATIVE
UE3 MOM: 1.11
WEIGHT: 177 [lb_av]
uE3 Value: 0.96 ng/mL

## 2016-02-23 NOTE — Telephone Encounter (Signed)
-----   Message from Linzie Collinavid James Evans, MD sent at 02/23/2016  8:13 AM EST ----- Fetal Screening test is negative for abnormalities.

## 2016-02-23 NOTE — Telephone Encounter (Signed)
Voicemail box not set up unable to leave a message.

## 2016-02-24 NOTE — Telephone Encounter (Signed)
Several unsuccessful attempts to contact by phone- letter sent.

## 2016-02-24 NOTE — Telephone Encounter (Signed)
-----   Message from David James Evans, MD sent at 02/23/2016  8:13 AM EST ----- Fetal Screening test is negative for abnormalities. 

## 2016-02-24 NOTE — Telephone Encounter (Signed)
Attempted to contact patient- no answer and voice mailbox not set up.

## 2016-03-01 ENCOUNTER — Ambulatory Visit (INDEPENDENT_AMBULATORY_CARE_PROVIDER_SITE_OTHER): Payer: BC Managed Care – PPO

## 2016-03-01 DIAGNOSIS — Z3492 Encounter for supervision of normal pregnancy, unspecified, second trimester: Secondary | ICD-10-CM

## 2016-03-15 ENCOUNTER — Ambulatory Visit (INDEPENDENT_AMBULATORY_CARE_PROVIDER_SITE_OTHER): Payer: BC Managed Care – PPO | Admitting: Obstetrics and Gynecology

## 2016-03-15 VITALS — BP 119/75 | HR 97 | Wt 202.0 lb

## 2016-03-15 DIAGNOSIS — Z3482 Encounter for supervision of other normal pregnancy, second trimester: Secondary | ICD-10-CM | POA: Diagnosis not present

## 2016-03-15 DIAGNOSIS — Z0000 Encounter for general adult medical examination without abnormal findings: Secondary | ICD-10-CM | POA: Insufficient documentation

## 2016-03-15 LAB — POCT URINALYSIS DIPSTICK
Bilirubin, UA: NEGATIVE
Blood, UA: NEGATIVE
Glucose, UA: NEGATIVE
KETONES UA: NEGATIVE
Leukocytes, UA: NEGATIVE
Nitrite, UA: NEGATIVE
PH UA: 8
Protein, UA: NEGATIVE
Spec Grav, UA: <=1.005
Urobilinogen, UA: NEGATIVE

## 2016-03-15 NOTE — Progress Notes (Signed)
ROB: Patient c/o intermittent SOB.  Also notes that she may be having some allergies.  Discussed use of Zyrtec, Claritin, or Benadryl.  Also can use humidifier.  To inform of SOB worsens. S/p normal anatomy scan, normal quad screen. RTC in 4 weeks.

## 2016-04-12 ENCOUNTER — Encounter: Payer: Self-pay | Admitting: Obstetrics and Gynecology

## 2016-04-12 ENCOUNTER — Ambulatory Visit (INDEPENDENT_AMBULATORY_CARE_PROVIDER_SITE_OTHER): Payer: BC Managed Care – PPO | Admitting: Obstetrics and Gynecology

## 2016-04-12 VITALS — BP 107/70 | HR 91 | Wt 207.4 lb

## 2016-04-12 DIAGNOSIS — Z3492 Encounter for supervision of normal pregnancy, unspecified, second trimester: Secondary | ICD-10-CM

## 2016-04-12 LAB — POCT URINALYSIS DIPSTICK
Bilirubin, UA: NEGATIVE
Blood, UA: NEGATIVE
Glucose, UA: NEGATIVE
Ketones, UA: NEGATIVE
Leukocytes, UA: NEGATIVE
NITRITE UA: NEGATIVE
PH UA: 7.5 (ref 5.0–8.0)
Protein, UA: NEGATIVE
SPEC GRAV UA: 1.015 (ref 1.030–1.035)
UROBILINOGEN UA: NEGATIVE (ref ?–2.0)

## 2016-04-12 NOTE — Progress Notes (Signed)
ROB:  No problems.  1hr GCT with next visit.

## 2016-05-10 ENCOUNTER — Other Ambulatory Visit: Payer: Self-pay | Admitting: Obstetrics and Gynecology

## 2016-05-10 ENCOUNTER — Encounter: Payer: Self-pay | Admitting: Obstetrics and Gynecology

## 2016-05-10 ENCOUNTER — Ambulatory Visit (INDEPENDENT_AMBULATORY_CARE_PROVIDER_SITE_OTHER): Payer: BC Managed Care – PPO | Admitting: Obstetrics and Gynecology

## 2016-05-10 ENCOUNTER — Other Ambulatory Visit: Payer: BC Managed Care – PPO

## 2016-05-10 VITALS — BP 107/70 | HR 91 | Wt 212.4 lb

## 2016-05-10 DIAGNOSIS — Z23 Encounter for immunization: Secondary | ICD-10-CM | POA: Diagnosis not present

## 2016-05-10 DIAGNOSIS — Z3483 Encounter for supervision of other normal pregnancy, third trimester: Secondary | ICD-10-CM

## 2016-05-10 LAB — POCT URINALYSIS DIPSTICK
BILIRUBIN UA: NEGATIVE
Blood, UA: NEGATIVE
Glucose, UA: NEGATIVE
Ketones, UA: NEGATIVE
LEUKOCYTES UA: NEGATIVE
Nitrite, UA: NEGATIVE
PH UA: 7.5 (ref 5.0–8.0)
PROTEIN UA: NEGATIVE
Spec Grav, UA: 1.01 (ref 1.010–1.025)
Urobilinogen, UA: NEGATIVE E.U./dL — AB

## 2016-05-10 NOTE — Progress Notes (Signed)
ROB:  No problems.  Tdap given, 1 hr GCT today, Discussed GBS and pt's previous pregnancy with GBS.

## 2016-05-11 LAB — GLUCOSE, 1 HOUR GESTATIONAL: Gestational Diabetes Screen: 80 mg/dL (ref 65–139)

## 2016-05-11 LAB — HEMOGLOBIN AND HEMATOCRIT, BLOOD
Hematocrit: 34.5 % (ref 34.0–46.6)
Hemoglobin: 11.5 g/dL (ref 11.1–15.9)

## 2016-05-30 ENCOUNTER — Ambulatory Visit (INDEPENDENT_AMBULATORY_CARE_PROVIDER_SITE_OTHER): Payer: BC Managed Care – PPO | Admitting: Obstetrics and Gynecology

## 2016-05-30 VITALS — BP 124/79 | HR 89 | Wt 218.7 lb

## 2016-05-30 DIAGNOSIS — B9689 Other specified bacterial agents as the cause of diseases classified elsewhere: Secondary | ICD-10-CM

## 2016-05-30 DIAGNOSIS — Z3483 Encounter for supervision of other normal pregnancy, third trimester: Secondary | ICD-10-CM

## 2016-05-30 DIAGNOSIS — N760 Acute vaginitis: Secondary | ICD-10-CM

## 2016-05-30 LAB — POCT URINALYSIS DIPSTICK
BILIRUBIN UA: NEGATIVE
GLUCOSE UA: NEGATIVE
Ketones, UA: NEGATIVE
Nitrite, UA: NEGATIVE
PH UA: 7.5 (ref 5.0–8.0)
Protein, UA: NEGATIVE
Spec Grav, UA: 1.01 (ref 1.010–1.025)
Urobilinogen, UA: 0.2 E.U./dL

## 2016-05-30 MED ORDER — CLINDAMYCIN PHOSPHATE (1 DOSE) 2 % VA CREA: 1.0000 | TOPICAL_CREAM | Freq: Once | VAGINAL | 0 refills | Status: AC

## 2016-05-30 NOTE — Progress Notes (Signed)
ROB: Complains of mildly itch vaginal discharge with odor, similar to last BV infection several months ago.  Patient does not desire a prescription for Flagyl but would like to try something else.  Will prescribe Clindesse. RTC in 2 weeks.

## 2016-05-30 NOTE — Patient Instructions (Signed)
PREVENTION OF RECURRENT VAGINITIS:   1. First and foremost, eat a healthy diet rich in fruits and vegetables and low in sugar and refined carbohydrates. Studies have shown that yeast and harmful bacteria are more likely to be a problem in people with high sugar diets. One food believed to be particularly helpful is garlic. 2. Support your immune system: reduce stress, get adequate sleep and exercise regularly. 3. Eat plain, probiotic yogurt daily or take a daily vaginal probiotic (can be found in the feminine aisle at any pharmacy) containing Acidophillus (they should contain at least 1 billion CFU's- this is information found on the bottle).  4. Avoid harsh soaps, and detergents.  Can use vaginal washes like Vagisil or Summer's Eve.  5.Avoid routine douching, and if you notice that sex can trigger infections for you, use condoms. For some women who get infections easily, both douching and the alkaline nature of semen can disrupt the vaginal pH.      Bacterial Vaginosis Bacterial vaginosis is a vaginal infection that occurs when the normal balance of bacteria in the vagina is disrupted. It results from an overgrowth of certain bacteria. This is the most common vaginal infection among women ages 15-44. Because bacterial vaginosis increases your risk for STIs (sexually transmitted infections), getting treated can help reduce your risk for chlamydia, gonorrhea, herpes, and HIV (human immunodeficiency virus). Treatment is also important for preventing complications in pregnant women, because this condition can cause an early (premature) delivery. What are the causes? This condition is caused by an increase in harmful bacteria that are normally present in small amounts in the vagina. However, the reason that the condition develops is not fully understood. What increases the risk? The following factors may make you more likely to develop this condition:  Having a new sexual partner or multiple sexual  partners.  Having unprotected sex.  Douching.  Having an intrauterine device (IUD).  Smoking.  Drug and alcohol abuse.  Taking certain antibiotic medicines.  Being pregnant.  You cannot get bacterial vaginosis from toilet seats, bedding, swimming pools, or contact with objects around you. What are the signs or symptoms? Symptoms of this condition include:  Grey or white vaginal discharge. The discharge can also be watery or foamy.  A fish-like odor with discharge, especially after sexual intercourse or during menstruation.  Itching in and around the vagina.  Burning or pain with urination.  Some women with bacterial vaginosis have no signs or symptoms. How is this diagnosed? This condition is diagnosed based on:  Your medical history.  A physical exam of the vagina.  Testing a sample of vaginal fluid under a microscope to look for a large amount of bad bacteria or abnormal cells. Your health care provider may use a cotton swab or a small wooden spatula to collect the sample.  How is this treated? This condition is treated with antibiotics. These may be given as a pill, a vaginal cream, or a medicine that is put into the vagina (suppository). If the condition comes back after treatment, a second round of antibiotics may be needed. Follow these instructions at home: Medicines  Take over-the-counter and prescription medicines only as told by your health care provider.  Take or use your antibiotic as told by your health care provider. Do not stop taking or using the antibiotic even if you start to feel better. General instructions  If you have a female sexual partner, tell her that you have a vaginal infection. She should see her health   care provider and be treated if she has symptoms. If you have a female sexual partner, he does not need treatment.  During treatment: ? Avoid sexual activity until you finish treatment. ? Do not douche. ? Avoid alcohol as directed by  your health care provider. ? Avoid breastfeeding as directed by your health care provider.  Drink enough water and fluids to keep your urine clear or pale yellow.  Keep the area around your vagina and rectum clean. ? Wash the area daily with warm water. ? Wipe yourself from front to back after using the toilet.  Keep all follow-up visits as told by your health care provider. This is important. How is this prevented?  Do not douche.  Wash the outside of your vagina with warm water only.  Use protection when having sex. This includes latex condoms and dental dams.  Limit how many sexual partners you have. To help prevent bacterial vaginosis, it is best to have sex with just one partner (monogamous).  Make sure you and your sexual partner are tested for STIs.  Wear cotton or cotton-lined underwear.  Avoid wearing tight pants and pantyhose, especially during summer.  Limit the amount of alcohol that you drink.  Do not use any products that contain nicotine or tobacco, such as cigarettes and e-cigarettes. If you need help quitting, ask your health care provider.  Do not use illegal drugs. Where to find more information:  Centers for Disease Control and Prevention: www.cdc.gov/std  American Sexual Health Association (ASHA): www.ashastd.org  [place] Department of Health and Human Services, Office on Women's Health: www.womenshealth.gov/ or https://www.womenshealth.gov/a-z-topics/bacterial-vaginosis Contact a health care provider if:  Your symptoms do not improve, even after treatment.  You have more discharge or pain when urinating.  You have a fever.  You have pain in your abdomen.  You have pain during sex.  You have vaginal bleeding between periods. Summary  Bacterial vaginosis is a vaginal infection that occurs when the normal balance of bacteria in the vagina is disrupted.  Because bacterial vaginosis increases your risk for STIs (sexually transmitted infections),  getting treated can help reduce your risk for chlamydia, gonorrhea, herpes, and HIV (human immunodeficiency virus). Treatment is also important for preventing complications in pregnant women, because the condition can cause an early (premature) delivery.  This condition is treated with antibiotic medicines. These may be given as a pill, a vaginal cream, or a medicine that is put into the vagina (suppository). This information is not intended to replace advice given to you by your health care provider. Make sure you discuss any questions you have with your health care provider. Document Released: 01/01/2005 Document Revised: 09/17/2015 Document Reviewed: 09/17/2015 Elsevier Interactive Patient Education  2017 [employer]vier Inc.  

## 2016-05-31 ENCOUNTER — Encounter: Payer: BC Managed Care – PPO | Admitting: Obstetrics and Gynecology

## 2016-05-31 ENCOUNTER — Encounter: Payer: Self-pay | Admitting: *Deleted

## 2016-05-31 ENCOUNTER — Observation Stay
Admission: EM | Admit: 2016-05-31 | Discharge: 2016-05-31 | Disposition: A | Discharge status: Home / Self Care | Payer: BC Managed Care – PPO | Attending: Obstetrics and Gynecology | Admitting: Obstetrics and Gynecology

## 2016-05-31 DIAGNOSIS — O212 Late vomiting of pregnancy: Principal | ICD-10-CM | POA: Insufficient documentation

## 2016-05-31 DIAGNOSIS — Z3A31 31 weeks gestation of pregnancy: Secondary | ICD-10-CM | POA: Insufficient documentation

## 2016-05-31 DIAGNOSIS — O219 Vomiting of pregnancy, unspecified: Secondary | ICD-10-CM | POA: Diagnosis present

## 2016-05-31 MED ORDER — LACTATED RINGERS IV SOLN
INTRAVENOUS | Status: DC
  Administered 2016-05-31: 1000 mL via INTRAVENOUS

## 2016-05-31 MED ORDER — ACETAMINOPHEN 325 MG PO TABS: 650.0000 mg | ORAL_TABLET | ORAL | Status: DC | PRN

## 2016-05-31 MED ORDER — ONDANSETRON HCL 4 MG/2ML IJ SOLN
4.0000 mg | Freq: Once | INTRAMUSCULAR | Status: AC
  Administered 2016-05-31: 4 mg via INTRAVENOUS
  Filled 2016-05-31: qty 2

## 2016-05-31 NOTE — OB Triage Note (Signed)
Presents with complaint of n/v this morning x 2, and also some cramping but states it is in the top of her stomach. Denies bleeding, leaking fluid,   States she later took her PNV with a sip of water and it has stayed down.

## 2016-06-01 NOTE — Discharge Summary (Signed)
     L&D OB Triage Note  SUBJECTIVE Kelly Kelly is a 31 y.o. G3P1011 female at 6249w1d, EDD Estimated Date of Delivery: 08/02/16 who presented to triage with complaints of nausea and vomiting.   This began earlier today.  She reports normal fetal movement. She denies contractions.  Obstetric History   G3   P1   T1   P0   A1   L1    SAB0   TAB0   Ectopic0   Multiple0   Live Births1     # Outcome Date GA Lbr Len/2nd Weight Sex Delivery Anes PTL Lv  3 Current           2 Term 07/17/14 6847w4d 11:50 / 01:08 8 lb 4.9 oz (3.768 kg) M Vag-Spont EPI  LIV     Apgar1:  7                Apgar5: 9  1 AB               No prescriptions prior to admission.     OBJECTIVE  Nursing Evaluation:   Temp 98.1 F (36.7 C) (Oral)   Resp 18   LMP 10/27/2015 (Exact Date)    Findings:  Occasional contractions but mild-patient not feeling them.         Nausea and vomiting resolved with IV hydration and Zofran.  NST was performed and has been reviewed by me.  NST INTERPRETATION: Category I  Mode:  (off for discharge) Baseline Rate (A): 145 bpm Variability: Moderate Accelerations: 15 x 15 Decelerations: None     Contraction Frequency (min): irritability  ASSESSMENT Impression:  1. Pregnancy:  G3P1011 at 1849w1d , EDD Estimated Date of Delivery: 08/02/16 2. Nausea and vomiting-resolved with IV hydration and Zofran. PLAN 1. Reassurance given 2. Discharge home with precautions to return to L&D or call the office if symptoms return or patient has additional problems. 3. Continue routine prenatal care.

## 2016-06-04 ENCOUNTER — Telehealth: Payer: Self-pay | Admitting: Obstetrics and Gynecology

## 2016-06-04 NOTE — Telephone Encounter (Signed)
Patient was seen in L&D with a stomach bug. She didn't know if she needs to follow up ion the office this week or wait until her appointment next week. Please Advise.

## 2016-06-05 NOTE — Telephone Encounter (Signed)
Message left on voice mail- followup to Mychart message from 06/04/16

## 2016-06-07 NOTE — Telephone Encounter (Signed)
Spoke with pt to see how she was feeling. States she should have called sooner but she feels better. Encouraged to keep scheduled appointment on 06/13/16.

## 2016-06-13 ENCOUNTER — Ambulatory Visit (INDEPENDENT_AMBULATORY_CARE_PROVIDER_SITE_OTHER): Payer: BC Managed Care – PPO | Admitting: Obstetrics and Gynecology

## 2016-06-13 VITALS — BP 115/73 | HR 96 | Wt 221.0 lb

## 2016-06-13 DIAGNOSIS — N760 Acute vaginitis: Secondary | ICD-10-CM

## 2016-06-13 DIAGNOSIS — Z3483 Encounter for supervision of other normal pregnancy, third trimester: Secondary | ICD-10-CM

## 2016-06-13 DIAGNOSIS — Z3009 Encounter for other general counseling and advice on contraception: Secondary | ICD-10-CM

## 2016-06-13 DIAGNOSIS — B9689 Other specified bacterial agents as the cause of diseases classified elsewhere: Secondary | ICD-10-CM

## 2016-06-13 LAB — POCT URINALYSIS DIPSTICK
Bilirubin, UA: NEGATIVE
Glucose, UA: NEGATIVE
Ketones, UA: NEGATIVE
NITRITE UA: NEGATIVE
SPEC GRAV UA: 1.015 (ref 1.010–1.025)
UROBILINOGEN UA: 0.2 U/dL
pH, UA: 6.5 (ref 5.0–8.0)

## 2016-06-13 MED ORDER — CLINDAMYCIN HCL 300 MG PO CAPS: 300.0000 mg | ORAL_CAPSULE | Freq: Two times a day (BID) | ORAL | 0 refills | Status: DC

## 2016-06-13 NOTE — Progress Notes (Signed)
ROB: Could not get Clindesse filled due to cost.  Will prescribe Clindamycin (does not tolerate Flagyl well).  Desires to breastfeed.  Unsure of contraceptive option desires, but considering some form of LARC.  Discussed contraceptive options.  RTC in 2 weeks.

## 2016-06-27 ENCOUNTER — Ambulatory Visit (INDEPENDENT_AMBULATORY_CARE_PROVIDER_SITE_OTHER): Payer: BC Managed Care – PPO | Admitting: Obstetrics and Gynecology

## 2016-06-27 ENCOUNTER — Encounter: Payer: Self-pay | Admitting: Obstetrics and Gynecology

## 2016-06-27 VITALS — BP 102/62 | HR 85 | Wt 221.2 lb

## 2016-06-27 DIAGNOSIS — O26843 Uterine size-date discrepancy, third trimester: Secondary | ICD-10-CM

## 2016-06-27 DIAGNOSIS — Z3493 Encounter for supervision of normal pregnancy, unspecified, third trimester: Secondary | ICD-10-CM

## 2016-06-27 LAB — POCT URINALYSIS DIPSTICK
BILIRUBIN UA: NEGATIVE
GLUCOSE UA: NEGATIVE
Ketones, UA: NEGATIVE
Leukocytes, UA: NEGATIVE
NITRITE UA: NEGATIVE
Protein, UA: NEGATIVE
RBC UA: NEGATIVE
Spec Grav, UA: <=1.005 — AB (ref 1.010–1.025)
Urobilinogen, UA: 0.2 E.U./dL
pH, UA: 5 (ref 5.0–8.0)

## 2016-06-27 NOTE — Progress Notes (Signed)
ROB:  Finished clindamycin.  Discussed size> dates.  Rec U/S near term for size.  Tested pelvis to 8#5 oz but she says "the shoulders got stuck a little".  Briefly discussed macrosomia and shoulder dystocia.  Needs GC/CT and GBS next visit.

## 2016-07-12 ENCOUNTER — Ambulatory Visit (INDEPENDENT_AMBULATORY_CARE_PROVIDER_SITE_OTHER): Payer: BC Managed Care – PPO | Admitting: Obstetrics and Gynecology

## 2016-07-12 VITALS — BP 122/70 | HR 92 | Wt 223.5 lb

## 2016-07-12 DIAGNOSIS — Z3483 Encounter for supervision of other normal pregnancy, third trimester: Secondary | ICD-10-CM

## 2016-07-12 DIAGNOSIS — Z3685 Encounter for antenatal screening for Streptococcus B: Secondary | ICD-10-CM

## 2016-07-12 DIAGNOSIS — Z113 Encounter for screening for infections with a predominantly sexual mode of transmission: Secondary | ICD-10-CM

## 2016-07-12 LAB — POCT URINALYSIS DIPSTICK
Bilirubin, UA: NEGATIVE
Blood, UA: NEGATIVE
GLUCOSE UA: NEGATIVE
KETONES UA: NEGATIVE
Nitrite, UA: NEGATIVE
Protein, UA: NEGATIVE
Spec Grav, UA: 1.01 (ref 1.010–1.025)
Urobilinogen, UA: 0.2 E.U./dL
pH, UA: 7.5 (ref 5.0–8.0)

## 2016-07-12 LAB — OB RESULTS CONSOLE GBS: STREP GROUP B AG: POSITIVE

## 2016-07-12 NOTE — Progress Notes (Signed)
ROB: Patient doing well, no complaints.  Answered questions regarding breastfeeding. Initial BP elevated with normal repeat. 36 week labs done today. RTC in 1 week. Discussed labor precautions.

## 2016-07-12 NOTE — Addendum Note (Signed)
Addended by: Debbe BalesGLANTON, Candie Gintz C on: 07/12/2016 11:07 AM   Modules accepted: Orders

## 2016-07-12 NOTE — Patient Instructions (Addendum)
Breastfeeding and Inducing Lactation Induced lactation is a process in which a woman who is not producing breast milk is made to produce it. Induced lactation may be done in cases of:  Adoption.  Having another woman give birth to your baby (surrogacy).  Restarting breastfeeding after stopping it for a period of time.  A mother who is nursing an older toddler and wants to meet the milk supply for a newborn.  Induced lactation is more likely to be successful in women who have been pregnant before. How does the body produce breast milk? The process of producing breast milk starts when you get pregnant. At this time, hormones in your body change to prepare your body to make breast milk. Once your baby is born, your hormones send signals that tell your body to make breast milk. How does induced lactation work? Induced lactation reproduces the process that the body naturally goes through to make breast milk. To help make breast milk, you may need to:  Take medicines.  Practice breast stimulation techniques. Breast stimulation techniques mimic a baby suckling at the breast. They can be done by: ? Gently rubbing and stretching your nipples. ? Using a double electric hospital-grade pump to pump your breasts.  If you choose induced lactation, you may need to start taking medicines 3-4 months before you want to start breastfeeding. About 6 weeks before the planned breastfeeding start date, you may need to stop taking the medicines and start doing breast stimulation techniques several times per day. If you use a pump to pump your breasts, you may need to pump both breasts at the same time every 3 hours (8 times a day) for 20 minutes. Once your body is making milk and you start breastfeeding, your body will naturally increase the amount of milk it makes in response to the smell, sound, and feel of your baby. Will I make enough milk to feed my baby? Very few women are able to make all the milk their baby  needs. If you choose induced lactation, you may need to supplement feedings with donated breast milk or infant formula to make sure your baby gets enough nutrition. What else do I need to know?  Take medicines only as directed by your health care provider or trained lactation consultant.  Herbal medicines are available to induce lactation. These medicines are not approved or regulated by the FDA. Always check with your health care provider before using any herbal medicines.  If you need guidance, talk to your health care provider or lactation consultant. He or she may be able to help you start a milk supply and advise you in making important decisions about feeding your baby.  Induced lactation may cause you to experience some changes in your body, such as: ? Mild to moderate changes in your menstrual cycle. ? Some breast changes that include a feeling of fullness. ? Some changes in your breast shape. ? Milk leaking from your breasts from time to time.  Supplemental nursing systems are available to provide extra donated breast milk or formula at the breast while a baby nurses. The systems ensure that an infant gets enough nutrition during breastfeeding. Ask a lactation specialist for help finding and using this device.  Newborns or babies younger than 1 month old usually root at and accept the breast when using a supplemental nursing system. Rooting is when a baby opens his or her mouth upon being stroked on the cheek or lips. Contact a health care provider if:    Your baby is older than 5 days old and: ? Does not seem satisfied after feeding at the breast. ? Is not producing 5-6 wet diapers per day. ? Is not producing 3 stools per day. Get help right away if:  Your breasts become swollen, red, and tender. Summary  Pregnancy naturally prepares the breasts to make breast milk. Induced lactation is a process in which a woman who is not producing breast milk is made to produce  it.  Lactation is usually induced by taking medicines and practicing breast stimulation techniques.  Very few women are able to make all the milk their baby needs. You may need to supplement feedings with donated breast milk or infant formula to make sure your baby gets enough nutrition. This information is not intended to replace advice given to you by your health care provider. Make sure you discuss any questions you have with your health care provider. Document Released: 01/01/2005 Document Revised: 01/03/2016 Document Reviewed: 01/03/2016 Elsevier Interactive Patient Education  2017 Elsevier Inc.  

## 2016-07-14 LAB — GC/CHLAMYDIA PROBE AMP
Chlamydia trachomatis, NAA: NEGATIVE
NEISSERIA GONORRHOEAE BY PCR: NEGATIVE

## 2016-07-15 LAB — CULTURE, BETA STREP (GROUP B ONLY): STREP GP B CULTURE: POSITIVE — AB

## 2016-07-19 ENCOUNTER — Ambulatory Visit (INDEPENDENT_AMBULATORY_CARE_PROVIDER_SITE_OTHER): Payer: BC Managed Care – PPO | Admitting: Obstetrics and Gynecology

## 2016-07-19 ENCOUNTER — Encounter: Payer: Self-pay | Admitting: Obstetrics and Gynecology

## 2016-07-19 VITALS — BP 112/69 | HR 86 | Wt 226.1 lb

## 2016-07-19 DIAGNOSIS — Z3493 Encounter for supervision of normal pregnancy, unspecified, third trimester: Secondary | ICD-10-CM

## 2016-07-19 LAB — POCT URINALYSIS DIPSTICK
Bilirubin, UA: NEGATIVE
Blood, UA: NEGATIVE
GLUCOSE UA: NEGATIVE
KETONES UA: NEGATIVE
LEUKOCYTES UA: NEGATIVE
Nitrite, UA: NEGATIVE
PROTEIN UA: NEGATIVE
Spec Grav, UA: 1.01 (ref 1.010–1.025)
UROBILINOGEN UA: 0.2 U/dL
pH, UA: 5 (ref 5.0–8.0)

## 2016-07-19 NOTE — Progress Notes (Signed)
ROB:  BP = NL    Rare contractions.  Denies problems.  S&S discussed.  Pt declined cervical exam.

## 2016-07-31 ENCOUNTER — Ambulatory Visit (INDEPENDENT_AMBULATORY_CARE_PROVIDER_SITE_OTHER): Payer: BC Managed Care – PPO | Admitting: Obstetrics and Gynecology

## 2016-07-31 VITALS — BP 138/78 | HR 91 | Wt 229.7 lb

## 2016-07-31 DIAGNOSIS — O480 Post-term pregnancy: Secondary | ICD-10-CM

## 2016-07-31 DIAGNOSIS — O99820 Streptococcus B carrier state complicating pregnancy: Secondary | ICD-10-CM

## 2016-07-31 DIAGNOSIS — Z3483 Encounter for supervision of other normal pregnancy, third trimester: Secondary | ICD-10-CM

## 2016-07-31 HISTORY — DX: Streptococcus B carrier state complicating pregnancy: O99.820

## 2016-07-31 LAB — POCT URINALYSIS DIPSTICK
Bilirubin, UA: NEGATIVE
GLUCOSE UA: NEGATIVE
Ketones, UA: NEGATIVE
NITRITE UA: NEGATIVE
Spec Grav, UA: 1.025 (ref 1.010–1.025)
UROBILINOGEN UA: 0.2 U/dL
pH, UA: 6 (ref 5.0–8.0)

## 2016-07-31 NOTE — Progress Notes (Signed)
ROB: Doing well, no complaints. Notes Deberah PeltonBraxton Hicks. Reiterated labor precautions. Advised patient on home methods of helping labor progress. RTC in 1 week if undelivered.  For AFI and NST next week. Can plan for IOL at 41 weeks.  Discussed GBS+ status, need for abx in labor.

## 2016-08-06 ENCOUNTER — Telehealth: Payer: Self-pay | Admitting: Obstetrics and Gynecology

## 2016-08-06 NOTE — Telephone Encounter (Signed)
Pt aware an iol will be scheduled and discussed at visit on 08/09/2016.

## 2016-08-06 NOTE — Telephone Encounter (Signed)
Patient lvm stating that she would like to speak with a Nurse or Doctor in regards to her wanting to be induced fairly soon, The patient is 41 weeks. Patient did not disclose any other information, Other than wanting a call back. Please advise.

## 2016-08-09 ENCOUNTER — Ambulatory Visit (INDEPENDENT_AMBULATORY_CARE_PROVIDER_SITE_OTHER): Payer: BC Managed Care – PPO | Admitting: Obstetrics and Gynecology

## 2016-08-09 ENCOUNTER — Ambulatory Visit (INDEPENDENT_AMBULATORY_CARE_PROVIDER_SITE_OTHER): Payer: BC Managed Care – PPO

## 2016-08-09 ENCOUNTER — Ambulatory Visit: Payer: BC Managed Care – PPO

## 2016-08-09 DIAGNOSIS — Z3493 Encounter for supervision of normal pregnancy, unspecified, third trimester: Secondary | ICD-10-CM | POA: Diagnosis not present

## 2016-08-09 DIAGNOSIS — O480 Post-term pregnancy: Secondary | ICD-10-CM

## 2016-08-09 LAB — POCT URINALYSIS DIPSTICK
BILIRUBIN UA: NEGATIVE
Blood, UA: NEGATIVE
GLUCOSE UA: NEGATIVE
KETONES UA: NEGATIVE
LEUKOCYTES UA: NEGATIVE
NITRITE UA: NEGATIVE
PH UA: 6 (ref 5.0–8.0)
Spec Grav, UA: 1.02 (ref 1.010–1.025)
Urobilinogen, UA: 0.2 E.U./dL

## 2016-08-09 NOTE — Progress Notes (Signed)
ROB:  BPP 8/8 with fetal wt estimated at 9#12oz.  we have discussed this estimated fetal weight in great detail. We've discussed the possibility of cesarean delivery versus vaginal delivery. The risks of vaginal delivery of a macrosomic infant discussed. I have offered her a cesarean delivery on Monday or consider induction Monday or Tuesday next week. Patient could not decide. She would like to call tomorrow and let us know of her decision. I believe she has a very good understanding of the risks and benefits of vaginal versus cesarean delivery especially in light of the macrosomic ultrasound. I have explained to her that ultrasounds 10 2 over estimated fetal weight at term but the larger the baby more possibility of complication. NONSTRESS TEST INTERPRETATION  INDICATIONS: post-dates pregnancy FHR baseline: 145 RESULTS:  reactive COMMENTS:   PLAN: 1. Continue fetal kick counts as directed. 2. Continue antepartum testing as scheduled.  Elonda Huskyavid J. Marquita Lias, M.D. 08/09/2016 5:25 PM

## 2016-08-10 ENCOUNTER — Telehealth: Payer: Self-pay | Admitting: Obstetrics and Gynecology

## 2016-08-10 NOTE — Telephone Encounter (Signed)
Spoke with pt- per provider pt needs to be at hospital on 08/14/16 at 6AM. Pt understands instructions.

## 2016-08-10 NOTE — Telephone Encounter (Signed)
Patient called with questions regarding her induction and when she needs to go to the hospital. Thanks

## 2016-08-11 ENCOUNTER — Inpatient Hospital Stay: Payer: BC Managed Care – PPO | Admitting: Anesthesiology

## 2016-08-11 ENCOUNTER — Inpatient Hospital Stay
Admission: EM | Admit: 2016-08-11 | Discharge: 2016-08-12 | DRG: 775 | Disposition: A | Discharge status: Home / Self Care | Payer: BC Managed Care – PPO | Attending: Obstetrics and Gynecology | Admitting: Obstetrics and Gynecology

## 2016-08-11 DIAGNOSIS — O3663X0 Maternal care for excessive fetal growth, third trimester, not applicable or unspecified: Principal | ICD-10-CM | POA: Diagnosis present

## 2016-08-11 DIAGNOSIS — Z3490 Encounter for supervision of normal pregnancy, unspecified, unspecified trimester: Secondary | ICD-10-CM

## 2016-08-11 DIAGNOSIS — Z3A41 41 weeks gestation of pregnancy: Secondary | ICD-10-CM

## 2016-08-11 DIAGNOSIS — O99824 Streptococcus B carrier state complicating childbirth: Secondary | ICD-10-CM | POA: Diagnosis present

## 2016-08-11 DIAGNOSIS — Z3493 Encounter for supervision of normal pregnancy, unspecified, third trimester: Secondary | ICD-10-CM | POA: Diagnosis present

## 2016-08-11 LAB — TYPE AND SCREEN
ABO/RH(D): A POS
Antibody Screen: NEGATIVE

## 2016-08-11 LAB — CBC
HEMATOCRIT: 39.9 % (ref 35.0–47.0)
Hemoglobin: 13.5 g/dL (ref 12.0–16.0)
MCH: 30 pg (ref 26.0–34.0)
MCHC: 33.7 g/dL (ref 32.0–36.0)
MCV: 89 fL (ref 80.0–100.0)
Platelets: 330 10*3/uL (ref 150–440)
RBC: 4.49 MIL/uL (ref 3.80–5.20)
RDW: 15 % — ABNORMAL HIGH (ref 11.5–14.5)
WBC: 13.5 10*3/uL — AB (ref 3.6–11.0)

## 2016-08-11 LAB — RAPID HIV SCREEN (HIV 1/2 AB+AG)
HIV 1/2 ANTIBODIES: NONREACTIVE
HIV-1 P24 ANTIGEN - HIV24: NONREACTIVE

## 2016-08-11 MED ORDER — ZOLPIDEM TARTRATE 5 MG PO TABS: 5.0000 mg | ORAL_TABLET | Freq: Every evening | ORAL | Status: DC | PRN

## 2016-08-11 MED ORDER — SODIUM CHLORIDE 0.9 % IV SOLN
2.0000 g | Freq: Once | INTRAVENOUS | Status: AC
  Administered 2016-08-11: 2 g via INTRAVENOUS

## 2016-08-11 MED ORDER — SODIUM CHLORIDE 0.9 % IV SOLN
1.0000 g | INTRAVENOUS | Status: DC
  Administered 2016-08-11: 1 g via INTRAVENOUS
  Filled 2016-08-11 (×6): qty 1000

## 2016-08-11 MED ORDER — TETANUS-DIPHTH-ACELL PERTUSSIS 5-2.5-18.5 LF-MCG/0.5 IM SUSP: 0.5000 mL | Freq: Once | INTRAMUSCULAR | Status: DC

## 2016-08-11 MED ORDER — ACETAMINOPHEN 325 MG PO TABS: 650.0000 mg | ORAL_TABLET | ORAL | Status: DC | PRN

## 2016-08-11 MED ORDER — LACTATED RINGERS IV SOLN: 500.0000 mL | INTRAVENOUS | Status: DC | PRN

## 2016-08-11 MED ORDER — OXYCODONE-ACETAMINOPHEN 5-325 MG PO TABS: 2.0000 | ORAL_TABLET | ORAL | Status: DC | PRN

## 2016-08-11 MED ORDER — FENTANYL 2.5 MCG/ML W/ROPIVACAINE 0.15% IN NS 100 ML EPIDURAL (ARMC)
EPIDURAL | Status: AC
  Filled 2016-08-11: qty 100

## 2016-08-11 MED ORDER — EPHEDRINE 5 MG/ML INJ
10.0000 mg | INTRAVENOUS | Status: DC | PRN
  Filled 2016-08-11: qty 2

## 2016-08-11 MED ORDER — OXYTOCIN 40 UNITS IN LACTATED RINGERS INFUSION - SIMPLE MED
2.5000 [IU]/h | INTRAVENOUS | Status: DC | PRN
Start: 2016-08-11 — End: 2016-08-12
  Filled 2016-08-11: qty 1000

## 2016-08-11 MED ORDER — PRENATAL MULTIVITAMIN CH
1.0000 | ORAL_TABLET | Freq: Every day | ORAL | Status: DC
  Administered 2016-08-12: 1 via ORAL
  Filled 2016-08-11: qty 1

## 2016-08-11 MED ORDER — IBUPROFEN 600 MG PO TABS
600.0000 mg | ORAL_TABLET | Freq: Four times a day (QID) | ORAL | Status: DC
  Administered 2016-08-11 – 2016-08-12 (×5): 600 mg via ORAL
  Filled 2016-08-11 (×5): qty 1

## 2016-08-11 MED ORDER — OXYCODONE-ACETAMINOPHEN 5-325 MG PO TABS: 1.0000 | ORAL_TABLET | ORAL | Status: DC | PRN

## 2016-08-11 MED ORDER — DIPHENHYDRAMINE HCL 50 MG/ML IJ SOLN: 12.5000 mg | INTRAMUSCULAR | Status: DC | PRN

## 2016-08-11 MED ORDER — AMMONIA AROMATIC IN INHA
RESPIRATORY_TRACT | Status: AC
  Filled 2016-08-11: qty 10

## 2016-08-11 MED ORDER — OXYTOCIN BOLUS FROM INFUSION: 500.0000 mL | Freq: Once | INTRAVENOUS | Status: DC

## 2016-08-11 MED ORDER — SODIUM CHLORIDE 0.9 % IV SOLN
INTRAVENOUS | Status: AC
  Filled 2016-08-11: qty 2000

## 2016-08-11 MED ORDER — LACTATED RINGERS IV SOLN: 500.0000 mL | Freq: Once | INTRAVENOUS | Status: DC

## 2016-08-11 MED ORDER — DIPHENHYDRAMINE HCL 25 MG PO CAPS: 25.0000 mg | ORAL_CAPSULE | Freq: Four times a day (QID) | ORAL | Status: DC | PRN

## 2016-08-11 MED ORDER — SODIUM CHLORIDE 0.9 % IV SOLN
INTRAVENOUS | Status: AC
  Administered 2016-08-11: 1 g via INTRAVENOUS
  Filled 2016-08-11: qty 1000

## 2016-08-11 MED ORDER — MISOPROSTOL 200 MCG PO TABS
ORAL_TABLET | ORAL | Status: AC
  Filled 2016-08-11: qty 4

## 2016-08-11 MED ORDER — DOCUSATE SODIUM 100 MG PO CAPS
100.0000 mg | ORAL_CAPSULE | Freq: Two times a day (BID) | ORAL | Status: DC
  Administered 2016-08-12 (×2): 100 mg via ORAL
  Filled 2016-08-11 (×2): qty 1

## 2016-08-11 MED ORDER — ONDANSETRON HCL 4 MG/2ML IJ SOLN: 4.0000 mg | Freq: Four times a day (QID) | INTRAMUSCULAR | Status: DC | PRN

## 2016-08-11 MED ORDER — BENZOCAINE-MENTHOL 20-0.5 % EX AERO
1.0000 "application " | INHALATION_SPRAY | CUTANEOUS | Status: DC | PRN
  Filled 2016-08-11: qty 56

## 2016-08-11 MED ORDER — LACTATED RINGERS IV SOLN
INTRAVENOUS | Status: DC
  Administered 2016-08-11: 05:00:00 via INTRAVENOUS

## 2016-08-11 MED ORDER — COCONUT OIL OIL: 1.0000 "application " | TOPICAL_OIL | Status: DC | PRN

## 2016-08-11 MED ORDER — PHENYLEPHRINE 40 MCG/ML (10ML) SYRINGE FOR IV PUSH (FOR BLOOD PRESSURE SUPPORT)
80.0000 ug | PREFILLED_SYRINGE | INTRAVENOUS | Status: DC | PRN
  Filled 2016-08-11: qty 5

## 2016-08-11 MED ORDER — LIDOCAINE HCL (PF) 1 % IJ SOLN
INTRAMUSCULAR | Status: AC
  Filled 2016-08-11: qty 30

## 2016-08-11 MED ORDER — OXYTOCIN 40 UNITS IN LACTATED RINGERS INFUSION - SIMPLE MED
2.5000 [IU]/h | INTRAVENOUS | Status: DC
  Administered 2016-08-11: 39.96 [IU]/h via INTRAVENOUS
  Administered 2016-08-11: 2.5 [IU]/h via INTRAVENOUS
  Filled 2016-08-11: qty 1000

## 2016-08-11 MED ORDER — DIBUCAINE 1 % RE OINT
1.0000 | TOPICAL_OINTMENT | RECTAL | Status: DC | PRN
Start: 2016-08-11 — End: 2016-08-12

## 2016-08-11 MED ORDER — ONDANSETRON HCL 4 MG PO TABS: 4.0000 mg | ORAL_TABLET | ORAL | Status: DC | PRN

## 2016-08-11 MED ORDER — SIMETHICONE 80 MG PO CHEW
80.0000 mg | CHEWABLE_TABLET | ORAL | Status: DC | PRN
Start: 2016-08-11 — End: 2016-08-12

## 2016-08-11 MED ORDER — BUPIVACAINE HCL (PF) 0.25 % IJ SOLN
INTRAMUSCULAR | Status: DC | PRN
  Administered 2016-08-11: 5 mL via EPIDURAL

## 2016-08-11 MED ORDER — FENTANYL 2.5 MCG/ML W/ROPIVACAINE 0.15% IN NS 100 ML EPIDURAL (ARMC)
12.0000 mL/h | EPIDURAL | Status: DC
  Administered 2016-08-11: 10 mL/h via EPIDURAL

## 2016-08-11 MED ORDER — ONDANSETRON HCL 4 MG/2ML IJ SOLN: 4.0000 mg | INTRAMUSCULAR | Status: DC | PRN

## 2016-08-11 MED ORDER — WITCH HAZEL-GLYCERIN EX PADS: 1.0000 "application " | MEDICATED_PAD | CUTANEOUS | Status: DC | PRN

## 2016-08-11 MED ORDER — OXYTOCIN 10 UNIT/ML IJ SOLN
INTRAMUSCULAR | Status: AC
  Filled 2016-08-11: qty 2

## 2016-08-11 NOTE — Anesthesia Procedure Notes (Signed)
Epidural Patient location during procedure: OB  Staffing Anesthesiologist: Zulema Pulaski Performed: anesthesiologist   Preanesthetic Checklist Completed: patient identified, site marked, surgical consent, pre-op evaluation, timeout performed, IV checked, risks and benefits discussed and monitors and equipment checked  Epidural Patient position: sitting Prep: Betadine Patient monitoring: heart rate, continuous pulse ox and blood pressure Approach: midline Location: L4-L5 Injection technique: LOR saline  Needle:  Needle type: Tuohy  Needle gauge: 18 G Needle length: 9 cm and 9 Catheter type: closed end flexible Catheter size: 20 Guage Test dose: negative and 1.5% lidocaine with Epi 1:200 K  Assessment Sensory level: T10 Events: blood not aspirated, injection not painful, no injection resistance, negative IV test and no paresthesia  Additional Notes   Patient tolerated the insertion well without complications.Reason for block:procedure for pain     

## 2016-08-11 NOTE — H&P (Signed)
History and Physical   HPI  Kelly Kelly is a 31 y.o. G3P1011 at 8278w2d Estimated Date of Delivery: 08/02/16 who is being admitted for labor management   OB History  Obstetric History   G3   P1   T1   P0   A1   L1    SAB0   TAB0   Ectopic0   Multiple0   Live Births1     # Outcome Date GA Lbr Len/2nd Weight Sex Delivery Anes PTL Lv  3 Current           2 Term 07/17/14 9398w4d 11:50 / 01:08 8 lb 4.9 oz (3.768 kg) M Vag-Spont EPI  LIV     Apgar1:  7                Apgar5: 9  1 AB               PROBLEM LIST  Pregnancy complications or risks: Patient Active Problem List   Diagnosis Date Noted  . Pregnancy 08/11/2016  . GBS (group B Streptococcus carrier), +RV culture, currently pregnant 07/31/2016  . Supervision of normal intrauterine pregnancy in multigravida in second trimester 03/15/2016    Prenatal labs and studies: ABO, Rh: --/--/A POS (07/28 16100443) Antibody: NEG (07/28 0443) RUE:[id number]:Positive (06/28 0000)   Past Medical History:  Diagnosis Date  . Medical history non-contributory      Past Surgical History:  Procedure Laterality Date  . NO PAST SURGERIES       Medications    Current Discharge Medication List    CONTINUE these medications which have NOT CHANGED   Details  Prenatal Vit-Fe Fumarate-FA (PRENATAL PO) Take 1 tablet by mouth daily.         Allergies  Patient has no known allergies.  Review of Systems  Pertinent items are noted in HPI.  Physical Exam  BP 136/85   Pulse (!) 102   Temp 98.1 F (36.7 C) (Oral)   Resp 18   Ht 5\' 7"  (1.702 m)   Wt 229 lb (103.9 kg)   LMP 10/27/2015 (Exact Date)   BMI 35.87 kg/m   Lungs:  CTA B Cardio: RRR without M/R/G Abd: Soft, gravid, NT Presentation: cephalic EXT: No C/C/ 1+ Edema DTRs: 2+ B CERVIX: 7cm :75%: -3: mid position: soft  See Prenatal records for more detailed PE.     FHR:  Variability: Good {> 6 bpm)  Toco: Uterine Contractions: Q 2-3 min   Test  Results  Results for orders placed or performed during the hospital encounter of 08/11/16 (from the past 24 hour(s))  CBC     Status: Abnormal   Collection Time: 08/11/16  4:43 AM  Result Value Ref Range   WBC 13.5 (H) 3.6 - 11.0 K/uL   RBC 4.49 3.80 - 5.20 MIL/uL   Hemoglobin 13.5 12.0 - 16.0 g/dL   HCT 91.439.9 78.235.0 - 95.647.0 %   MCV 89.0 80.0 - 100.0 fL   MCH 30.0 26.0 - 34.0 pg   MCHC 33.7 32.0 - 36.0 g/dL   RDW 21.315.0 (H) 08.611.5 - 57.814.5 %   Platelets 330 150 - 440 K/uL  Type and screen [facility]     Status: None   Collection Time: 08/11/16  4:43 AM  Result Value Ref Range   ABO/RH(D) A POS    Antibody Screen NEG    Sample Expiration 08/14/2016   Rapid HIV screen (HIV 1/2 Ab+Ag)  Status: None   Collection Time: 08/11/16  4:43 AM  Result Value Ref Range   HIV-1 P24 Antigen - HIV24 NON REACTIVE NON REACTIVE   HIV 1/2 Antibodies NON REACTIVE NON REACTIVE   Interpretation (HIV Ag Ab)      A non reactive test result means that HIV 1 or HIV 2 antibodies and HIV 1 p24 antigen were not detected in the specimen.   Group B Strep positive  Assessment   G3P1011 at 4673w2d Estimated Date of Delivery: 08/02/16  The fetus is reassuring.  Recent U/S reveals macrosomic infant - 9# 12 oz  - pt declined elective cd.  Patient Active Problem List   Diagnosis Date Noted  . Pregnancy 08/11/2016  . GBS (group B Streptococcus carrier), +RV culture, currently pregnant 07/31/2016  . Supervision of normal intrauterine pregnancy in multigravida in second trimester 03/15/2016    Plan  1. Admit to L&D for anticipate vaginal delivery 2. EFM: -- Category 1 3. Epidural if desired. Stadol for IV pain until epidural requested. 4. Admission labs    Elonda Huskyavid J. Evans, M.D. 08/11/2016 10:07 AM

## 2016-08-11 NOTE — Anesthesia Preprocedure Evaluation (Signed)
Anesthesia Evaluation  Patient identified by MRN, date of birth, ID band Patient awake    Reviewed: Allergy & Precautions, NPO status , Patient's Chart, lab work & pertinent test results, reviewed documented beta blocker date and time   Airway Mallampati: III  TM Distance: >3 FB     Dental  (+) Chipped   Pulmonary           Cardiovascular      Neuro/Psych    GI/Hepatic   Endo/Other    Renal/GU      Musculoskeletal   Abdominal   Peds  Hematology   Anesthesia Other Findings   Reproductive/Obstetrics                             Anesthesia Physical Anesthesia Plan  ASA: III  Anesthesia Plan: Epidural   Post-op Pain Management:    Induction:   PONV Risk Score and Plan:   Airway Management Planned:   Additional Equipment:   Intra-op Plan:   Post-operative Plan:   Informed Consent: I have reviewed the patients History and Physical, chart, labs and discussed the procedure including the risks, benefits and alternatives for the proposed anesthesia with the patient or authorized representative who has indicated his/her understanding and acceptance.     Plan Discussed with: CRNA  Anesthesia Plan Comments:         Anesthesia Quick Evaluation

## 2016-08-11 NOTE — Progress Notes (Signed)
LABOR NOTE    **Written after delivery**  Kelly Kelly 31 y.o.GP@ at 636w2d Active phase labor.  SUBJECTIVE:  She presented today with contractions. OBJECTIVE:  BP 136/85   Pulse (!) 102   Temp 98.1 F (36.7 C) (Oral)   Resp 18   Ht 5\' 7"  (1.702 m)   Wt 229 lb (103.9 kg)   LMP 10/27/2015 (Exact Date)   BMI 35.87 kg/m  Total I/O In: -  Out: 300 [Urine:300]  She has shown cervical change. CERVIX: 10cm :100%: -3 SVE:   Dilation: 10 Effacement (%): 100 Station: -3 Exam by:: Dr. Logan BoresEvans CONTRACTIONS: regular, every 3 minutes FHR: Fetal heart tracing reviewed. Accelerations: Reactive Category I   Analgesia: Epidural  Labs: Lab Results  Component Value Date   WBC 13.5 (H) 08/11/2016   HGB 13.5 08/11/2016   HCT 39.9 08/11/2016   MCV 89.0 08/11/2016   PLT 330 08/11/2016   AROM performed - clear fluid  ASSESSMENT: 1) Labor curve reviewed.       Progress: Satisfactory labor progress.       Active Problems:   Pregnancy   PLAN: Begin pushing  Elonda Huskyavid J. Ean Gettel, M.D. 08/11/2016 10:11 AM

## 2016-08-12 LAB — RPR: RPR: NONREACTIVE

## 2016-08-12 NOTE — Anesthesia Postprocedure Evaluation (Signed)
Anesthesia Post Note  Patient: Kelly Kelly  Procedure(s) Performed: * No procedures listed *  Patient location during evaluation: Mother Baby Anesthesia Type: Epidural Level of consciousness: awake and alert Pain management: pain level controlled Vital Signs Assessment: post-procedure vital signs reviewed and stable Respiratory status: spontaneous breathing, nonlabored ventilation and respiratory function stable Cardiovascular status: stable Postop Assessment: no headache, no backache and patient able to bend at knees Anesthetic complications: no     Last Vitals:  Vitals:   08/12/16 0401 08/12/16 0737  BP: 115/80 118/70  Pulse: 81 77  Resp: 17 18  Temp: 36.7 C 36.9 C    Last Pain:  Vitals:   08/12/16 0737  TempSrc: Oral  PainSc:                  Cleda MccreedyJoseph K Piscitello

## 2016-08-12 NOTE — Progress Notes (Signed)
Patient discharged home with infant. Discharge instructions, prescriptions and follow up appointment given to and reviewed with patient. Patient verbalized understanding. Pt wheeled out with baby by auxiliary.

## 2016-08-12 NOTE — Discharge Summary (Signed)
                               Discharge Summary  Date of Admission: 08/11/2016  Date of Discharge:   Admitting Diagnosis: Onset of Labor at 3031w2d  Secondary Diagnosis:   Mode of Delivery: normal spontaneous vaginal delivery       Discharge Diagnosis:    Intrapartum Procedures: Atificial rupture of membranes and GBS prophylaxis   Post partum procedures:   Complications: none                      Discharge Day SOAP Note:  Progress Note - Vaginal Delivery  Kelly Kelly is a 31 y.o. [id number] now PP day 1 s/p Vaginal, Spontaneous Delivery . Delivery was uncomplicated  Subjective  The patient has the following complaints: has no unusual complaints  Pain is controlled with current medications.   Patient is urinating without difficulty.  She is ambulating well.    Objective  Vital signs: BP 118/70 (BP Location: Left Arm)   Pulse 77   Temp 98.4 F (36.9 C) (Oral)   Resp 18   Ht 5\' 7"  (1.702 m)   Wt 229 lb (103.9 kg)   LMP 10/27/2015 (Exact Date)   SpO2 99%   Breastfeeding? Unknown   BMI 35.87 kg/m   Physical Exam: Gen: NAD Fundus Fundal Tone: Firm  Lochia Amount: Small  Perineum Appearance: Intact, Edematous     Data Review Labs: CBC Latest Ref Rng & Units 08/11/2016 05/10/2016 07/18/2014  WBC 3.6 - 11.0 K/uL 13.5(H) - -  Hemoglobin 12.0 - 16.0 g/dL 45.413.5 09.811.5 11.912.2  Hematocrit 35.0 - 47.0 % 39.9 34.5 36.4  Platelets 150 - 440 K/uL 330 - -   A POS  Assessment/Plan  Active Problems:   Pregnancy    Plan for discharge today.   Discharge Instructions: Per After Visit Summary. Activity: Advance as tolerated. Pelvic rest for 6 weeks.  Also refer to After Visit Summary Diet: Regular Medications: Allergies as of 08/12/2016   No Known Allergies     Medication List    TAKE these medications   PRENATAL PO Take 1 tablet by mouth daily.      Outpatient follow up:  Follow-up Information    Linzie CollinEvans, Yaniv Lage James, MD Follow up in 6 week(s).     Specialty:  Obstetrics and Gynecology Contact information: [address] [place] [address] [phone]          Postpartum contraception: discuss at 6 week check  Discharged Condition: good  Discharged to: home  Newborn Data: Disposition:home with mother  Apgars: APGAR (1 MIN): 8   APGAR (5 MINS): 8   APGAR (10 MINS):    Baby Feeding: Breast    Kelly Huskyavid J. Latanja Kelly, M.D. 08/12/2016 10:18 AM

## 2017-10-14 ENCOUNTER — Ambulatory Visit: Payer: BC Managed Care – PPO | Admitting: Family Medicine

## 2017-10-15 LAB — OB RESULTS CONSOLE HIV ANTIBODY (ROUTINE TESTING): HIV: NONREACTIVE

## 2017-10-15 LAB — OB RESULTS CONSOLE HGB/HCT, BLOOD
HCT: 37 (ref 29–41)
Hemoglobin: 13.1

## 2017-10-15 LAB — OB RESULTS CONSOLE PLATELET COUNT: Platelets: 364

## 2017-10-16 ENCOUNTER — Other Ambulatory Visit: Payer: Self-pay | Admitting: Nurse Practitioner

## 2017-10-16 DIAGNOSIS — Z369 Encounter for antenatal screening, unspecified: Secondary | ICD-10-CM

## 2017-10-16 LAB — OB RESULTS CONSOLE HEPATITIS B SURFACE ANTIGEN: Hepatitis B Surface Ag: NEGATIVE

## 2017-10-16 LAB — OB RESULTS CONSOLE RPR: RPR: NONREACTIVE

## 2017-10-17 LAB — OB RESULTS CONSOLE GC/CHLAMYDIA
Chlamydia: NEGATIVE
Gonorrhea: NEGATIVE

## 2017-10-21 ENCOUNTER — Ambulatory Visit
Admission: RE | Admit: 2017-10-21 | Discharge: 2017-10-21 | Disposition: A | Discharge status: Home / Self Care | Payer: BC Managed Care – PPO | Source: Ambulatory Visit | Attending: Obstetrics and Gynecology | Admitting: Obstetrics and Gynecology

## 2017-10-21 DIAGNOSIS — Z832 Family history of diseases of the blood and blood-forming organs and certain disorders involving the immune mechanism: Secondary | ICD-10-CM | POA: Insufficient documentation

## 2017-10-21 DIAGNOSIS — Z369 Encounter for antenatal screening, unspecified: Secondary | ICD-10-CM | POA: Insufficient documentation

## 2017-10-21 DIAGNOSIS — Z3A13 13 weeks gestation of pregnancy: Secondary | ICD-10-CM | POA: Diagnosis not present

## 2017-10-21 HISTORY — DX: Family history of diseases of the blood and blood-forming organs and certain disorders involving the immune mechanism: Z83.2

## 2017-10-21 NOTE — Progress Notes (Signed)
Referring Provider: Donn Pierini Length of Consultation: 30 minutes  Kelly Kelly  was referred to [facility] for genetic counseling to review family history of sickle cell trait and discuss prenatal screening and testing options.  Kelly Kelly was [redacted]w[redacted]d at the time of counseling.  EDD is 04/28/2018, confirmed by today's ultrasound.  This note summarizes the information we discussed.    Pregnancy/Family History We obtained a detailed family history and pregnancy history.  This is the 4th pregnancy for the patient and her husband, Sharia Reeve, age 30.  They have two boys, ages 1 and 30, who are in good health.  Of note, the patient reports that her 30 year old was found to have sickle cell trait, identified through [place] newborn screening.  The couple also has a history of one pregnancy termination, due to personal reasons.  We also discussed that 1 in 10 individuals of African American ancestry have sickle cell trait.  Sickle cell anemia is caused by a change in the hemoglobin.  Hemoglobin is the substance in red blood cells that carries oxygen.  When there is a change in the structure of the hemoglobin, there are problems in the way the blood carries oxygen.  One specific change causes the cells to take on a sickle, or half moon, shape instead of the usual round shape.  This is called sickle cell anemia.  People with sickle cell disease are at an increased risk for infections, stroke, damage to certain organs, painful crises and other medical complications.  Like cystic fibrosis, sickle cell disease is an autosomal recessive condition.  Kelly Kelly had a hemoglobin electrophoresis drawn at her OB office on 10/15/2017.  Her results returned with normal adult hemoglobin.  Her most recent CBC resulted an MCV of 88.  Given her normal results, we discussed that Sharia Reeve is the obligate carrier for sickle cell trait.  He does report a distant cousin who passed away from sickle cell disease. We  reviewed that while the pregnancy is not anticipated to be an increased risk for sickle cell disease or other hemoglobinopathies, their children will have a 50% chance of carrying sickle cell trait.  We reviewed the availability of the [place] newborn screening for hemoglobinopathies.  The patient reports a maternal first cousin who lost a child at [redacted] weeks gestation due to a "heart condition."  The patient did not have further details regarding the type of heart disease or underlying etiology for loss.  Without additional information, we cannot adequately assess risk to other family members.    The couple both report mostly African American ancestry, with some distant Native American ancestry.  The remainder of the family history was reported to be unremarkable for birth defects, intellectual delays, recurrent pregnancy loss or known chromosome abnormalities.  Kelly Kelly denies any pregnancy complications.  She reports no exposures in pregnancy.  Genetic Counseling We offered the following routine screening tests for this pregnancy:  First trimester screening, which includes nuchal translucency ultrasound screen and first trimester maternal serum marker screening.  The nuchal translucency has approximately an 80% detection rate for Down syndrome and can be positive for other chromosome abnormalities as well as congenital heart defects.  When combined with a maternal serum marker screening, the detection rate is up to 90% for Down syndrome and up to 97% for trisomy 18.     Maternal serum marker screening, a blood test that measures pregnancy proteins, can provide risk assessments for Down syndrome, trisomy 18, and  open neural tube defects (spina bifida, anencephaly). Because it does not directly examine the fetus, it cannot positively diagnose or rule out these problems.  Targeted ultrasound uses high frequency sound waves to create an image of the developing fetus.  An ultrasound is often recommended  as a routine means of evaluating the pregnancy.  It is also used to screen for fetal anatomy problems (for example, a heart defect) that might be suggestive of a chromosomal or other abnormality.   Should these screening tests indicate an increased concern, then the following additional testing options would be offered:  The chorionic villus sampling procedure is available for first trimester chromosome analysis.  This involves the withdrawal of a small amount of chorionic villi (tissue from the developing placenta).  Risk of pregnancy loss is estimated to be approximately 1 in 200 to 1 in 100 (0.5 to 1%).  There is approximately a 1% (1 in 100) chance that the CVS chromosome results will be unclear.  Chorionic villi cannot be tested for neural tube defects.     Amniocentesis involves the removal of a small amount of amniotic fluid from the sac surrounding the fetus with the use of a thin needle inserted through the maternal abdomen and uterus.  Ultrasound guidance is used throughout the procedure.  Fetal cells from amniotic fluid are directly evaluated and > 99.5% of chromosome problems and > 98% of open neural tube defects can be detected. This procedure is generally performed after the 15th week of pregnancy.  The main risks to this procedure include complications leading to miscarriage in less than 1 in 200 cases (0.5%).  As another option for information if the pregnancy is suspected to be an an increased chance for certain chromosome conditions, we also reviewed the availability of cell free fetal DNA testing from maternal blood to determine whether or not the baby may have either Down syndrome, trisomy 44, or trisomy 79.  This test utilizes a maternal blood sample and DNA sequencing technology to isolate circulating cell free fetal DNA from maternal plasma.  The fetal DNA can then be analyzed for DNA sequences that are derived from the three most common chromosomes involved in aneuploidy, chromosomes  13, 18, and 21.  If the overall amount of DNA is greater than the expected level for any of these chromosomes, aneuploidy is suspected.  While we do not consider it a replacement for invasive testing and karyotype analysis, a negative result from this testing would be reassuring, though not a guarantee of a normal chromosome complement for the baby.  An abnormal result is certainly suggestive of an abnormal chromosome complement, though we would still recommend CVS or amniocentesis to confirm any findings from this testing.  Cystic Fibrosis and Spinal Muscular Atrophy (SMA) screening were also discussed with the patient. Both conditions are recessive, which means that both parents must be carriers in order to have a child with the disease. Cystic Fibrosis screening was also discussed with the patient. Cystic fibrosis (CF) is one of the most common genetic conditions in persons of Caucasian ancestry.  This condition occurs in approximately 1 in 2,500 Caucasian persons and results in thickened secretions in the lungs, digestive, and reproductive systems.  It is less common in individuals of other ancestries.  For a baby to be at risk for having CF, both of the parents must be carriers for this condition.  Approximately 1 in 81 persons of African American ancestry is a carrier for CF.  Current carrier testing looks  for the most common mutations in the gene for CF and can detect approximately 65% of carriers in the African American population.  This means that the carrier screening can reduce, but cannot eliminate, the chance for an individual to have a child with CF.  If an individual is found to be a carrier for CF, then carrier testing would be available for the partner. As part of [place]'s newborn screening profile, all babies born in the state of [place] will have a two-tier screening process.  Specimens are first tested to determine the concentration of immunoreactive trypsinogen (IRT).  The top  5% of specimens with the highest IRT values then undergo DNA testing using a panel of over 40 common CF mutations.  SMA is a neurodegenerative disorder that leads to atrophy of skeletal muscle and overall weakness.  This condition is also more prevalent in the Caucasian population, with 1 in 40-1 in 60 persons being a carrier and 1 in 6,000-1 in 10,000 children being affected.  There are multiple forms of the disease, with some causing death in infancy to other forms with survival into adulthood.  The genetics of SMA is complex, but carrier screening can detect up to 95% of carriers in the Caucasian population.  Similar to CF, a negative result can greatly reduce, but cannot eliminate, the chance to have a child with SMA.  Plan After consideration of the options, Kelly Kelly elected to proceed with first trimester screening only.  She declines CF and SMA carrier screening.  An ultrasound was performed at the time of the visit.  The gestational age was consistent with  [redacted]w[redacted]d.  Fetal anatomy could not be assessed due to early gestational age.  Please refer to the ultrasound report for details of that study.  Kelly Kelly was encouraged to call with questions or concerns.  We can be contacted at [phone].  Labs ordered: First Trimester Screening  Rosalva Ferron, MS, CGC performed an integral service incident to the physician's initial service.  I was physically present in the clinical area and was immediately available to render assistance.   Ellieanna Funderburg C Lee Kalt

## 2017-10-28 ENCOUNTER — Ambulatory Visit
Admission: RE | Admit: 2017-10-28 | Discharge: 2017-10-28 | Disposition: A | Discharge status: Home / Self Care | Payer: BC Managed Care – PPO | Source: Ambulatory Visit | Attending: Maternal and Fetal Medicine | Admitting: Maternal and Fetal Medicine

## 2017-10-28 ENCOUNTER — Other Ambulatory Visit
Admission: RE | Admit: 2017-10-28 | Discharge: 2017-10-28 | Disposition: A | Discharge status: Home / Self Care | Payer: BC Managed Care – PPO | Source: Ambulatory Visit | Attending: Maternal and Fetal Medicine | Admitting: Maternal and Fetal Medicine

## 2017-10-28 ENCOUNTER — Telehealth: Payer: Self-pay | Admitting: Obstetrics and Gynecology

## 2017-10-28 DIAGNOSIS — O281 Abnormal biochemical finding on antenatal screening of mother: Secondary | ICD-10-CM | POA: Insufficient documentation

## 2017-10-28 DIAGNOSIS — O285 Abnormal chromosomal and genetic finding on antenatal screening of mother: Secondary | ICD-10-CM

## 2017-10-28 HISTORY — DX: Abnormal chromosomal and genetic finding on antenatal screening of mother: O28.5

## 2017-10-28 NOTE — Telephone Encounter (Signed)
  The results of the First Trimester Nuchal Translucency and Biochemical Screening performed on 10/21/2017, are now available.  These results showed an increased risk for Trisomy 13 and 18.  Specifically, the risk for Trisomy 13/18 is estimated to be 1 in 129 (the prior risk based upon age alone was 1 in 39,130).   The risk for Down syndrome is now estimated to be 1 in 449.  As we discussed when we called with these results, if more definitive information is desired, we would offer amniocentesis.  Because we do not yet know the effectiveness of combined first and second trimester screening, we do not recommend a maternal serum screen to assess the chance for chromosome conditions.  However, if screening for neural tube defects is desired, maternal serum screening for AFP only can be performed between 15 and [redacted] weeks gestation.    We also reviewed the availability of cell free fetal DNA testing from maternal blood to determine whether or not the baby may have Down syndrome, trisomy 73, or trisomy 61.  This test utilizes a maternal blood sample and DNA sequencing technology to isolate circulating cell free fetal DNA from maternal plasma.  The fetal DNA can then be analyzed for DNA sequences that are derived from the three most common chromosomes involved in aneuploidy, chromosomes 13, 18, and 21.  If the overall amount of DNA is greater than the expected level for any of these chromosomes, aneuploidy is suspected.  While we do not consider it a replacement for invasive testing and karyotype analysis, a negative result from this testing would be reassuring, though not a guarantee of a normal chromosome complement for the baby.  An abnormal result is certainly suggestive of an abnormal chromosome complement, though we would still recommend CVS or amniocentesis to confirm any findings from this testing.  We have offered the option of additional genetic counseling at this time or at the time of a second trimester  ultrasound.  She plans to stop by the clinic for a lab draw only on 10/28/2017 at 4:00pm for MaterniT21 testing.   The patient was encouraged to contact us with any questions.  We may be reached at [phone].   Cherly Anderson, MS, CGC

## 2017-10-28 NOTE — Progress Notes (Signed)
MaterniT21 PLUS with SCA drawn today due to abnormal first trimester screening results with 1 in 129 trisomy 13/18 risk and low PAPP-A.  Her partner Darrelyn Morro) is a Paramedic, so today's testing should be at no charge. I emailed our [facility] rep to ensure the sample is accessioned under the proper account. The patient was sent to lab for blood draw.   I will contact her with results, we also need to include third trimester ultrasound for growth due to the low PAPP-A on first trimester screening.    We may be reached at [phone] with questions.  Cherly Anderson, MS, CGC

## 2017-11-04 ENCOUNTER — Telehealth: Payer: Self-pay | Admitting: Obstetrics and Gynecology

## 2017-11-04 LAB — MATERNIT21 PLUS CORE+SCA
CHROMOSOME 18: NEGATIVE
CHROMOSOME 21: NEGATIVE
Chromosome 13: NEGATIVE
Y CHROMOSOME: DETECTED

## 2017-11-04 NOTE — Telephone Encounter (Signed)
The patient was informed of the results of her recent MaterniT21 testing which yielded NEGATIVE results.  The patient's specimen showed DNA consistent with two copies of chromosomes 21, 18 and 13.  The sensitivity for trisomy 53, trisomy 64 and trisomy 13 using this testing are reported as 99.1%, 99.9% and 91.7% respectively.  Thus, while the results of this testing are highly accurate, they are not considered diagnostic at this time.  Should more definitive information be desired, the patient may still consider amniocentesis.   As requested to know by the patient, sex chromosome analysis was included for this sample.  Results are consistent with a female fetus (Y chromosome material was detected). This is predicted with >99% accuracy.  A maternal serum AFP only should be considered if screening for neural tube defects is desired.   As a reminder, we would recommend an ultrasound for growth in the third trimester due to the low PAPP-A on first trimester screening.  We may be reached at [phone] with any questions or concerns.   Cherly Anderson, MS, CGC

## 2017-11-28 ENCOUNTER — Other Ambulatory Visit: Payer: Self-pay | Admitting: Obstetrics and Gynecology

## 2017-11-28 DIAGNOSIS — Z3689 Encounter for other specified antenatal screening: Secondary | ICD-10-CM

## 2017-12-02 ENCOUNTER — Ambulatory Visit
Admission: RE | Admit: 2017-12-02 | Discharge: 2017-12-02 | Disposition: A | Discharge status: Home / Self Care | Payer: BC Managed Care – PPO | Source: Ambulatory Visit | Attending: Obstetrics & Gynecology | Admitting: Obstetrics & Gynecology

## 2017-12-02 DIAGNOSIS — Z3A19 19 weeks gestation of pregnancy: Secondary | ICD-10-CM | POA: Diagnosis not present

## 2017-12-02 DIAGNOSIS — Z3689 Encounter for other specified antenatal screening: Secondary | ICD-10-CM | POA: Diagnosis not present

## 2018-01-15 NOTE — L&D Delivery Note (Addendum)
Date of delivery: 05/01/2018 Estimated Date of Delivery: 04/28/18 Patient's last menstrual period was 08/11/2017. EGA: [redacted]w[redacted]d  Delivery Note At 7:43 AM a viable female was delivered via Vaginal, Spontaneous (Presentation: OP;  LOP).  APGAR: 8, 9; weight: 3940 g.   Placenta status: spontaneous, intact.  Cord:  with the following complications: none.  Cord pH: NA  Called to see patient. She was in the process of getting an epidural when she felt rectal pressure. She was found to be complete following epidural procedure.  Mom pushed well to deliver a viable female infant.  The head followed by shoulders, which delivered without difficulty, and the rest of the body.  No nuchal cord noted.  Baby to mom's chest.  Cord clamped and cut after > 1 min delay.  No cord blood obtained.  Placenta delivered spontaneously, intact, with a 3-vessel cord.   All counts correct.  Hemostasis obtained with IV pitocin and fundal massage.   Anesthesia:  epidural Episiotomy: None Lacerations:  none Suture Repair: NA Est. Blood Loss (mL):  350  Mom to postpartum.  Baby to Couplet care / Skin to Skin.  Tresea Mall, CNM 05/01/2018, 8:18 AM  I supervised the delivery of this infant and was present for the delivery of the baby and the placenta.  Adelene Idler MD [facility], [facility] 05/01/2018 8:53 AM

## 2018-01-16 DIAGNOSIS — F4322 Adjustment disorder with anxiety: Secondary | ICD-10-CM | POA: Insufficient documentation

## 2018-01-16 HISTORY — DX: Adjustment disorder with anxiety: F43.22

## 2018-02-06 ENCOUNTER — Other Ambulatory Visit: Payer: Self-pay | Admitting: Obstetrics & Gynecology

## 2018-02-06 ENCOUNTER — Other Ambulatory Visit: Payer: Self-pay

## 2018-02-06 DIAGNOSIS — Z832 Family history of diseases of the blood and blood-forming organs and certain disorders involving the immune mechanism: Secondary | ICD-10-CM

## 2018-02-06 DIAGNOSIS — Z0489 Encounter for examination and observation for other specified reasons: Secondary | ICD-10-CM

## 2018-02-06 DIAGNOSIS — IMO0002 Reserved for concepts with insufficient information to code with codable children: Secondary | ICD-10-CM

## 2018-02-10 ENCOUNTER — Inpatient Hospital Stay: Admission: RE | Admit: 2018-02-10 | Payer: BC Managed Care – PPO | Source: Ambulatory Visit

## 2018-04-02 LAB — OB RESULTS CONSOLE GC/CHLAMYDIA
Chlamydia: NEGATIVE
Gonorrhea: NEGATIVE

## 2018-04-24 ENCOUNTER — Other Ambulatory Visit: Payer: Self-pay | Admitting: Obstetrics & Gynecology

## 2018-04-24 ENCOUNTER — Other Ambulatory Visit: Payer: Self-pay

## 2018-04-24 ENCOUNTER — Ambulatory Visit (INDEPENDENT_AMBULATORY_CARE_PROVIDER_SITE_OTHER): Payer: BC Managed Care – PPO

## 2018-04-24 DIAGNOSIS — O0992 Supervision of high risk pregnancy, unspecified, second trimester: Secondary | ICD-10-CM

## 2018-04-24 DIAGNOSIS — O0993 Supervision of high risk pregnancy, unspecified, third trimester: Secondary | ICD-10-CM

## 2018-04-24 DIAGNOSIS — Z3A39 39 weeks gestation of pregnancy: Secondary | ICD-10-CM | POA: Diagnosis not present

## 2018-04-24 DIAGNOSIS — O26843 Uterine size-date discrepancy, third trimester: Secondary | ICD-10-CM

## 2018-04-28 DIAGNOSIS — R030 Elevated blood-pressure reading, without diagnosis of hypertension: Secondary | ICD-10-CM

## 2018-04-28 HISTORY — DX: Elevated blood-pressure reading, without diagnosis of hypertension: R03.0

## 2018-04-30 ENCOUNTER — Inpatient Hospital Stay
Admission: EM | Admit: 2018-04-30 | Discharge: 2018-05-02 | DRG: 807 | Disposition: A | Discharge status: Home / Self Care | Payer: BC Managed Care – PPO | Attending: Advanced Practice Midwife | Admitting: Advanced Practice Midwife

## 2018-04-30 ENCOUNTER — Other Ambulatory Visit: Payer: Self-pay

## 2018-04-30 DIAGNOSIS — O99344 Other mental disorders complicating childbirth: Secondary | ICD-10-CM | POA: Diagnosis present

## 2018-04-30 DIAGNOSIS — O99824 Streptococcus B carrier state complicating childbirth: Secondary | ICD-10-CM | POA: Diagnosis present

## 2018-04-30 DIAGNOSIS — O134 Gestational [pregnancy-induced] hypertension without significant proteinuria, complicating childbirth: Secondary | ICD-10-CM | POA: Diagnosis present

## 2018-04-30 DIAGNOSIS — F419 Anxiety disorder, unspecified: Secondary | ICD-10-CM | POA: Diagnosis present

## 2018-04-30 DIAGNOSIS — Z3A40 40 weeks gestation of pregnancy: Secondary | ICD-10-CM | POA: Diagnosis not present

## 2018-04-30 DIAGNOSIS — O480 Post-term pregnancy: Secondary | ICD-10-CM

## 2018-04-30 DIAGNOSIS — R030 Elevated blood-pressure reading, without diagnosis of hypertension: Secondary | ICD-10-CM | POA: Diagnosis present

## 2018-04-30 LAB — CBC WITH DIFFERENTIAL/PLATELET
Abs Immature Granulocytes: 0.04 10*3/uL (ref 0.00–0.07)
Basophils Absolute: 0 10*3/uL (ref 0.0–0.1)
Basophils Relative: 0 %
Eosinophils Absolute: 0.2 10*3/uL (ref 0.0–0.5)
Eosinophils Relative: 1 %
HCT: 38.6 % (ref 36.0–46.0)
Hemoglobin: 13.1 g/dL (ref 12.0–15.0)
Immature Granulocytes: 0 %
Lymphocytes Relative: 23 %
Lymphs Abs: 2.8 10*3/uL (ref 0.7–4.0)
MCH: 30.1 pg (ref 26.0–34.0)
MCHC: 33.9 g/dL (ref 30.0–36.0)
MCV: 88.7 fL (ref 80.0–100.0)
Monocytes Absolute: 0.5 10*3/uL (ref 0.1–1.0)
Monocytes Relative: 4 %
Neutro Abs: 8.5 10*3/uL — ABNORMAL HIGH (ref 1.7–7.7)
Neutrophils Relative %: 72 %
Platelets: 306 10*3/uL (ref 150–400)
RBC: 4.35 MIL/uL (ref 3.87–5.11)
RDW: 15.9 % — ABNORMAL HIGH (ref 11.5–15.5)
WBC: 12 10*3/uL — ABNORMAL HIGH (ref 4.0–10.5)
nRBC: 0 % (ref 0.0–0.2)

## 2018-04-30 LAB — COMPREHENSIVE METABOLIC PANEL
ALT: 10 U/L (ref 0–44)
AST: 16 U/L (ref 15–41)
Albumin: 3.1 g/dL — ABNORMAL LOW (ref 3.5–5.0)
Alkaline Phosphatase: 208 U/L — ABNORMAL HIGH (ref 38–126)
Anion gap: 12 (ref 5–15)
BUN: 13 mg/dL (ref 6–20)
CO2: 16 mmol/L — ABNORMAL LOW (ref 22–32)
Calcium: 9.2 mg/dL (ref 8.9–10.3)
Chloride: 106 mmol/L (ref 98–111)
Creatinine, Ser: 0.9 mg/dL (ref 0.44–1.00)
GFR calc Af Amer: >60 mL/min (ref >60)
GFR calc non Af Amer: >60 mL/min (ref >60)
Glucose, Bld: 100 mg/dL — ABNORMAL HIGH (ref 70–99)
Potassium: 3.7 mmol/L (ref 3.5–5.1)
Sodium: 134 mmol/L — ABNORMAL LOW (ref 135–145)
Total Bilirubin: 0.3 mg/dL (ref 0.3–1.2)
Total Protein: 7.7 g/dL (ref 6.5–8.1)

## 2018-04-30 LAB — TYPE AND SCREEN
ABO/RH(D): A POS
Antibody Screen: NEGATIVE

## 2018-04-30 LAB — PROTEIN / CREATININE RATIO, URINE
Creatinine, Urine: 81 mg/dL
Protein Creatinine Ratio: 0.25 mg/mg{Cre} — ABNORMAL HIGH (ref 0.00–0.15)
Total Protein, Urine: 20 mg/dL

## 2018-04-30 MED ORDER — TERBUTALINE SULFATE 1 MG/ML IJ SOLN: 0.2500 mg | Freq: Once | INTRAMUSCULAR | Status: DC | PRN

## 2018-04-30 MED ORDER — OXYTOCIN 40 UNITS IN NORMAL SALINE INFUSION - SIMPLE MED: 2.5000 [IU]/h | INTRAVENOUS | Status: DC

## 2018-04-30 MED ORDER — ONDANSETRON HCL 4 MG/2ML IJ SOLN: 4.0000 mg | Freq: Four times a day (QID) | INTRAMUSCULAR | Status: DC | PRN

## 2018-04-30 MED ORDER — ACETAMINOPHEN 325 MG PO TABS: 650.0000 mg | ORAL_TABLET | ORAL | Status: DC | PRN

## 2018-04-30 MED ORDER — LIDOCAINE HCL (PF) 1 % IJ SOLN
INTRAMUSCULAR | Status: AC
  Filled 2018-04-30: qty 30

## 2018-04-30 MED ORDER — OXYTOCIN BOLUS FROM INFUSION: 500.0000 mL | Freq: Once | INTRAVENOUS | Status: DC

## 2018-04-30 MED ORDER — BUTORPHANOL TARTRATE 2 MG/ML IJ SOLN: 1.0000 mg | INTRAMUSCULAR | Status: DC | PRN

## 2018-04-30 MED ORDER — OXYTOCIN 40 UNITS IN NORMAL SALINE INFUSION - SIMPLE MED
INTRAVENOUS | Status: AC
  Filled 2018-04-30: qty 1000

## 2018-04-30 MED ORDER — OXYTOCIN 10 UNIT/ML IJ SOLN
INTRAMUSCULAR | Status: AC
  Administered 2018-05-01: 10 [IU] via INTRAMUSCULAR
  Filled 2018-04-30: qty 2

## 2018-04-30 MED ORDER — LACTATED RINGERS IV SOLN: 500.0000 mL | INTRAVENOUS | Status: DC | PRN

## 2018-04-30 MED ORDER — LACTATED RINGERS IV SOLN
INTRAVENOUS | Status: DC
  Administered 2018-04-30 – 2018-05-01 (×3): via INTRAVENOUS

## 2018-04-30 MED ORDER — LIDOCAINE HCL (PF) 1 % IJ SOLN: 30.0000 mL | INTRAMUSCULAR | Status: DC | PRN

## 2018-04-30 MED ORDER — AMMONIA AROMATIC IN INHA
RESPIRATORY_TRACT | Status: AC
  Filled 2018-04-30: qty 10

## 2018-04-30 MED ORDER — SODIUM CHLORIDE 0.9 % IV SOLN
5.0000 10*6.[IU] | Freq: Once | INTRAVENOUS | Status: AC
  Administered 2018-04-30: 5 10*6.[IU] via INTRAVENOUS
  Filled 2018-04-30: qty 5

## 2018-04-30 MED ORDER — PENICILLIN G 3 MILLION UNITS IVPB - SIMPLE MED
3.0000 10*6.[IU] | INTRAVENOUS | Status: DC
  Administered 2018-05-01: 3 10*6.[IU] via INTRAVENOUS
  Filled 2018-04-30: qty 100
  Filled 2018-04-30: qty 3
  Filled 2018-04-30 (×4): qty 100

## 2018-04-30 MED ORDER — MISOPROSTOL 200 MCG PO TABS
ORAL_TABLET | ORAL | Status: AC
  Filled 2018-04-30: qty 4

## 2018-04-30 MED ORDER — MISOPROSTOL 25 MCG QUARTER TABLET
25.0000 ug | ORAL_TABLET | ORAL | Status: DC | PRN
  Administered 2018-04-30: 25 ug via VAGINAL
  Filled 2018-04-30: qty 1

## 2018-04-30 NOTE — OB Triage Note (Signed)
Pt is a 33yo G4P2 at [redacted]w[redacted]d that was sent over from [facility] for [facility] workup. Pt states she has a headache that comes and goes but denies one currently. She denies vision changes, epigastric pain, LOF,VB and states positive FM. Initial fht 155 with monitors applied and assessing. Pt has 2+ reflexes, no clonus and mild non pitting bilateral lower extremity edema. Initial BP 146/95 with cycling q15.

## 2018-04-30 NOTE — H&P (Addendum)
OB History & Physical   History of Present Illness:  Chief Complaint: elevated blood pressure at clinic visit  HPI:  Kelly Kelly is a 33 y.o. [id number] female at 48w2ddated by 13 week ultrasound.  Her pregnancy has been complicated by anxiety. She has a history of shoulder dystocia with G1 8#5oz baby. She delivered G2 9#8oz without difficulty. GBS bacteriuria. Abnormal 1st screen/low PAPP-A. Cell Free DNA wnl. Declined 3rd trimester growth scan due to financial reasons.    She reports occasional contractions.   She denies leakage of fluid.   She denies vaginal bleeding.   She reports fetal movement.  She has had a mild headache but attributes it to sinus congestion due to allergies. She denies visual changes or epigastric pain.   Maternal Medical History:   Past Medical History:  Diagnosis Date  . Medical history non-contributory     Past Surgical History:  Procedure Laterality Date  . NO PAST SURGERIES      No Known Allergies  Prior to Admission medications   Medication Sig Start Date End Date Taking? Authorizing Provider  loratadine (CLARITIN) 5 MG/5ML syrup Take 5 mg by mouth daily.   Yes [provider]  Prenatal Vit-Fe Fumarate-FA (PRENATAL PO) Take 1 tablet by mouth daily.   Yes [provider]    OB History  Gravida Para Term Preterm AB Living  '4 2 2 '$ 0 1 2  SAB TAB Ectopic Multiple Live Births        0 2    # Outcome Date GA Lbr Len/2nd Weight Sex Delivery Anes PTL Lv  4 Current           3 Term 08/11/16 450w2d 00:24 4320 g M Vag-Spont EPI  LIV  2 Term 07/17/14 3914w4d:50 / 01:08 3768 g M Vag-Spont EPI  LIV     Birth Comments: none  1 AB             Prenatal care site: [facility]  Social History: She  reports that she has never smoked. She has never used smokeless tobacco. She reports that she does not drink alcohol or use drugs.  Family History: family history includes Cancer in her maternal grandmother and paternal uncle; Diabetes in her  maternal grandmother and maternal uncle.    Review of Systems: Negative x 10 systems reviewed except as noted in the HPI.    Patient Vitals for the past 24 hrs:  BP Temp Temp src Pulse Resp Height Weight  04/30/18 2117 (!) 156/96 - - 98 - - -  04/30/18 2100 (!) 138/94 98.2 F (36.8 C) Oral (!) 110 18 - -  04/30/18 1908 136/87 - - 99 - - -  04/30/18 1853 139/84 - - (!) 102 - - -  04/30/18 1837 (!) 143/93 - - 100 - - -  04/30/18 1823 (!) 142/95 - - 97 - - -  04/30/18 1820 (!) 146/95 98.4 F (36.9 C) Oral (!) 101 18 '5\' 7"'$  (1.702 m) 110.2 kg  04/30/18 1808 (!) 146/95 - - (!) 108 - - -    Physical Exam:  Vital Signs: BP 136/87   Pulse 99   Temp 98.4 F (36.9 C) (Oral)   Resp 18   Ht '5\' 7"'$  (1.702 m)   Wt 110.2 kg   LMP 08/11/2017   BMI 38.06 kg/m  Constitutional: Well nourished, well developed female in no acute distress.  HEENT: normal Skin: Warm and dry.  Cardiovascular: Regular rate and rhythm.  Extremity: trace edema  Respiratory: Clear to auscultation bilateral. Normal respiratory effort Abdomen: FHT present Back: no CVAT Neuro: DTRs 2+, Cranial nerves grossly intact Psych: Alert and Oriented x3. No memory deficits. Normal mood and affect.  MS: normal gait, normal bilateral lower extremity ROM/strength/stability.  Pelvic exam: (female chaperone present) is not limited by body habitus EGBUS: within normal limits Vagina: within normal limits and with normal mucosa  Cervix: 3/50/-3 with sweep done and cytotec placed  Results for KEELIN, NEVILLE (MRN [id number]) as of 04/30/2018 20:32  Ref. Range 04/30/2018 18:25 04/30/2018 18:41  Sodium Latest Ref Range: 135 - 145 mmol/L  134 (L)  Potassium Latest Ref Range: 3.5 - 5.1 mmol/L  3.7  Chloride Latest Ref Range: 98 - 111 mmol/L  106  CO2 Latest Ref Range: 22 - 32 mmol/L  16 (L)  Glucose Latest Ref Range: 70 - 99 mg/dL  100 (H)  BUN Latest Ref Range: 6 - 20 mg/dL  13  Creatinine Latest Ref Range: 0.44 - 1.00 mg/dL  0.90   Calcium Latest Ref Range: 8.9 - 10.3 mg/dL  9.2  Anion gap Latest Ref Range: 5 - 15   12  Alkaline Phosphatase Latest Ref Range: 38 - 126 U/L  208 (H)  Albumin Latest Ref Range: 3.5 - 5.0 g/dL  3.1 (L)  AST Latest Ref Range: 15 - 41 U/L  16  ALT Latest Ref Range: 0 - 44 U/L  10  Total Protein Latest Ref Range: 6.5 - 8.1 g/dL  7.7  Total Bilirubin Latest Ref Range: 0.3 - 1.2 mg/dL  0.3  GFR, Est Non African American Latest Ref Range: >60 mL/min  >60  GFR, Est African American Latest Ref Range: >60 mL/min  >60  WBC Latest Ref Range: 4.0 - 10.5 K/uL  12.0 (H)  RBC Latest Ref Range: 3.87 - 5.11 MIL/uL  4.35  Hemoglobin Latest Ref Range: 12.0 - 15.0 g/dL  13.1  HCT Latest Ref Range: 36.0 - 46.0 %  38.6  MCV Latest Ref Range: 80.0 - 100.0 fL  88.7  MCH Latest Ref Range: 26.0 - 34.0 pg  30.1  MCHC Latest Ref Range: 30.0 - 36.0 g/dL  33.9  RDW Latest Ref Range: 11.5 - 15.5 %  15.9 (H)  Platelets Latest Ref Range: 150 - 400 K/uL  306  nRBC Latest Ref Range: 0.0 - 0.2 %  0.0  Neutrophils Latest Units: %  72  Lymphocytes Latest Units: %  23  Monocytes Relative Latest Units: %  4  Eosinophil Latest Units: %  1  Basophil Latest Units: %  0  Immature Granulocytes Latest Units: %  0  NEUT# Latest Ref Range: 1.7 - 7.7 K/uL  8.5 (H)  Lymphocyte # Latest Ref Range: 0.7 - 4.0 K/uL  2.8  Monocyte # Latest Ref Range: 0.1 - 1.0 K/uL  0.5  Eosinophils Absolute Latest Ref Range: 0.0 - 0.5 K/uL  0.2  Basophils Absolute Latest Ref Range: 0.0 - 0.1 K/uL  0.0  Abs Immature Granulocytes Latest Ref Range: 0.00 - 0.07 K/uL  0.04  Total Protein, Urine Latest Units: mg/dL 20   Protein Creatinine Ratio Latest Ref Range: 0.00 - 0.15 mg/mgCre 0.25 (H)   Creatinine, Urine Latest Units: mg/dL 81     Pertinent Results:  Prenatal Labs: Blood type/Rh A positive  Antibody screen negative  Rubella MMR x 2  Varicella Immune    RPR Non-reactive  HBsAg negative  HIV negative  GC negative  Chlamydia negative  Genetic screening See HPI  1 hour GTT 65  3 hour GTT NA  GBS GBS bacteriuria at NOB/36 week result not available   Baseline FHR: 130 beats/min   Variability: minimal to moderate   Accelerations: present   Decelerations: absent Contractions: rare Overall assessment: reassuring    Assessment:  Kelly Kelly is a 33 y.o. [id number] female at 39w2dwith mildly elevated blood pressure at term, normal labs. Given mildly elevated blood pressure at 40+ weeks, will keep patient for induction  Plan:  1. Admit to Labor & Delivery  2. CBC, T&S, Clrs, IVF 3. GBS positive in urine- will treat in labor with penicillin   4. Fetal well-being: Category I/II 5. Begin induction with cytotec 25 mcg  JRod Can CNM 04/30/2018 9:00 PM

## 2018-05-01 ENCOUNTER — Inpatient Hospital Stay: Payer: BC Managed Care – PPO | Admitting: Anesthesiology

## 2018-05-01 DIAGNOSIS — O99344 Other mental disorders complicating childbirth: Secondary | ICD-10-CM

## 2018-05-01 DIAGNOSIS — F419 Anxiety disorder, unspecified: Secondary | ICD-10-CM

## 2018-05-01 MED ORDER — ACETAMINOPHEN 325 MG PO TABS
650.0000 mg | ORAL_TABLET | ORAL | Status: DC | PRN
  Administered 2018-05-01 – 2018-05-02 (×3): 650 mg via ORAL
  Filled 2018-05-01 (×3): qty 2

## 2018-05-01 MED ORDER — TERBUTALINE SULFATE 1 MG/ML IJ SOLN: 0.2500 mg | Freq: Once | INTRAMUSCULAR | Status: DC | PRN

## 2018-05-01 MED ORDER — HYDRALAZINE HCL 20 MG/ML IJ SOLN: 10.0000 mg | INTRAMUSCULAR | Status: DC | PRN

## 2018-05-01 MED ORDER — PHENYLEPHRINE 40 MCG/ML (10ML) SYRINGE FOR IV PUSH (FOR BLOOD PRESSURE SUPPORT): 80.0000 ug | PREFILLED_SYRINGE | INTRAVENOUS | Status: DC | PRN

## 2018-05-01 MED ORDER — WITCH HAZEL-GLYCERIN EX PADS: 1.0000 "application " | MEDICATED_PAD | CUTANEOUS | Status: DC | PRN

## 2018-05-01 MED ORDER — LABETALOL HCL 5 MG/ML IV SOLN
INTRAVENOUS | Status: AC
  Administered 2018-05-01: 20 mg via INTRAVENOUS
  Filled 2018-05-01: qty 4

## 2018-05-01 MED ORDER — ONDANSETRON HCL 4 MG PO TABS: 4.0000 mg | ORAL_TABLET | ORAL | Status: DC | PRN

## 2018-05-01 MED ORDER — COCONUT OIL OIL
1.0000 "application " | TOPICAL_OIL | Status: DC | PRN
  Administered 2018-05-02: 1 via TOPICAL
  Filled 2018-05-01: qty 120

## 2018-05-01 MED ORDER — FENTANYL 2.5 MCG/ML W/ROPIVACAINE 0.15% IN NS 100 ML EPIDURAL (ARMC)
EPIDURAL | Status: AC
  Filled 2018-05-01: qty 100

## 2018-05-01 MED ORDER — OXYTOCIN 40 UNITS IN NORMAL SALINE INFUSION - SIMPLE MED
1.0000 m[IU]/min | INTRAVENOUS | Status: DC
  Administered 2018-05-01: 1 m[IU]/min via INTRAVENOUS

## 2018-05-01 MED ORDER — DIBUCAINE (PERIANAL) 1 % EX OINT: 1.0000 "application " | TOPICAL_OINTMENT | CUTANEOUS | Status: DC | PRN

## 2018-05-01 MED ORDER — EPHEDRINE 5 MG/ML INJ: 10.0000 mg | INTRAVENOUS | Status: DC | PRN

## 2018-05-01 MED ORDER — BENZOCAINE-MENTHOL 20-0.5 % EX AERO
1.0000 "application " | INHALATION_SPRAY | CUTANEOUS | Status: DC | PRN
  Administered 2018-05-02: 1 via TOPICAL
  Filled 2018-05-01: qty 56

## 2018-05-01 MED ORDER — BENZOCAINE-MENTHOL 20-0.5 % EX AERO: 1.0000 "application " | INHALATION_SPRAY | CUTANEOUS | Status: DC | PRN

## 2018-05-01 MED ORDER — DIPHENHYDRAMINE HCL 25 MG PO CAPS: 25.0000 mg | ORAL_CAPSULE | Freq: Four times a day (QID) | ORAL | Status: DC | PRN

## 2018-05-01 MED ORDER — LIDOCAINE HCL (PF) 1 % IJ SOLN
INTRAMUSCULAR | Status: DC | PRN
  Administered 2018-05-01: 1 mL

## 2018-05-01 MED ORDER — SENNOSIDES-DOCUSATE SODIUM 8.6-50 MG PO TABS
2.0000 | ORAL_TABLET | ORAL | Status: DC
  Filled 2018-05-01: qty 2

## 2018-05-01 MED ORDER — LABETALOL HCL 5 MG/ML IV SOLN: 80.0000 mg | INTRAVENOUS | Status: DC | PRN

## 2018-05-01 MED ORDER — ONDANSETRON HCL 4 MG/2ML IJ SOLN: 4.0000 mg | INTRAMUSCULAR | Status: DC | PRN

## 2018-05-01 MED ORDER — SODIUM CHLORIDE 0.9 % IV SOLN
INTRAVENOUS | Status: DC | PRN
  Administered 2018-05-01 (×3): 5 mL via EPIDURAL

## 2018-05-01 MED ORDER — TETANUS-DIPHTH-ACELL PERTUSSIS 5-2.5-18.5 LF-MCG/0.5 IM SUSP
0.5000 mL | Freq: Once | INTRAMUSCULAR | Status: DC
  Filled 2018-05-01: qty 0.5

## 2018-05-01 MED ORDER — PRENATAL MULTIVITAMIN CH: 1.0000 | ORAL_TABLET | Freq: Every day | ORAL | Status: DC

## 2018-05-01 MED ORDER — COCONUT OIL OIL: 1.0000 "application " | TOPICAL_OIL | Status: DC | PRN

## 2018-05-01 MED ORDER — IBUPROFEN 600 MG PO TABS: 600.0000 mg | ORAL_TABLET | Freq: Four times a day (QID) | ORAL | Status: DC

## 2018-05-01 MED ORDER — ACETAMINOPHEN 325 MG PO TABS: 650.0000 mg | ORAL_TABLET | ORAL | Status: DC | PRN

## 2018-05-01 MED ORDER — PRENATAL MULTIVITAMIN CH
1.0000 | ORAL_TABLET | Freq: Every day | ORAL | Status: DC
  Administered 2018-05-01: 1 via ORAL
  Filled 2018-05-01: qty 1

## 2018-05-01 MED ORDER — ZOLPIDEM TARTRATE 5 MG PO TABS: 5.0000 mg | ORAL_TABLET | Freq: Every evening | ORAL | Status: DC | PRN

## 2018-05-01 MED ORDER — SIMETHICONE 80 MG PO CHEW: 80.0000 mg | CHEWABLE_TABLET | ORAL | Status: DC | PRN

## 2018-05-01 MED ORDER — FENTANYL 2.5 MCG/ML W/ROPIVACAINE 0.15% IN NS 100 ML EPIDURAL (ARMC)
12.0000 mL/h | EPIDURAL | Status: DC
  Administered 2018-05-01: 12 mL/h via EPIDURAL

## 2018-05-01 MED ORDER — LABETALOL HCL 5 MG/ML IV SOLN
40.0000 mg | INTRAVENOUS | Status: DC | PRN
  Administered 2018-05-01: 40 mg via INTRAVENOUS
  Filled 2018-05-01: qty 8

## 2018-05-01 MED ORDER — OXYTOCIN 10 UNIT/ML IJ SOLN
10.0000 [IU] | Freq: Once | INTRAMUSCULAR | Status: AC
  Administered 2018-05-01: 10 [IU] via INTRAMUSCULAR

## 2018-05-01 MED ORDER — DIPHENHYDRAMINE HCL 50 MG/ML IJ SOLN: 12.5000 mg | INTRAMUSCULAR | Status: DC | PRN

## 2018-05-01 MED ORDER — LACTATED RINGERS IV SOLN: 500.0000 mL | Freq: Once | INTRAVENOUS | Status: DC

## 2018-05-01 MED ORDER — LABETALOL HCL 5 MG/ML IV SOLN
20.0000 mg | INTRAVENOUS | Status: DC | PRN
  Administered 2018-05-01: 20 mg via INTRAVENOUS
  Filled 2018-05-01: qty 4

## 2018-05-01 MED ORDER — IBUPROFEN 600 MG PO TABS
600.0000 mg | ORAL_TABLET | Freq: Four times a day (QID) | ORAL | Status: DC
  Administered 2018-05-01 – 2018-05-02 (×4): 600 mg via ORAL
  Filled 2018-05-01 (×5): qty 1

## 2018-05-01 NOTE — Progress Notes (Signed)
   05/01/18 0700  Clinical Encounter Type  Visited With Patient not available  Visit Type Initial  Referral From Nurse  Consult/Referral To Chaplain  Chaplain received OR for AD and went to visit patient. Chaplain spoke with nurse concerning the hold on AD due to the COVID-19 and offered to educate the patient. Patient was receiving and epidural at that moment and nurse explained that she will inform the patient and staff of the change.

## 2018-05-01 NOTE — Discharge Summary (Signed)
OB Discharge Summary     Patient Name: Kelly PoissonLashawna Kelly DOB: 03/02/1985 MRN: [id number]  Date of admission: 04/30/2018 Delivering provider: Tresea MallJane Gledhill, CNM  Date of Delivery: 05/01/2018  Date of discharge: 05/02/2018  Admitting diagnosis: [redacted] wks pregnant high bp Intrauterine pregnancy: 2051w3d     Secondary diagnosis: Gestational Hypertension     Discharge diagnosis: Term Pregnancy Delivered                                                                                                Post partum procedures: None  Augmentation: Pitocin and Cytotec  Complications: None  Hospital course:  Induction of Labor With Vaginal Delivery   33 y.o. yo [id number] at 6951w3d was admitted to the hospital 04/30/2018 for induction of labor.  Indication for induction: Gestational hypertension.  Patient had an uncomplicated labor course as follows: Membrane Rupture Time/Date: 7:35 AM ,05/01/2018   Patient had delivery of viable female 7:43 AM,05/01/2018 Details of delivery can be found in separate delivery note.  Patient had a routine postpartum course. Patient is discharged home 05/02/18.  Physical exam  Vitals:   05/02/18 0045 05/02/18 0055 05/02/18 0414 05/02/18 0741  BP: 113/78 (!) 145/84 110/73 134/75  Pulse: 74 83 88 84  Resp: 18 20 20 20   Temp: 98.7 F (37.1 C) 98.3 F (36.8 C) 98.2 F (36.8 C) 98.2 F (36.8 C)  TempSrc: Axillary Oral Oral Oral  SpO2:  98% 100% 100%  Weight:      Height:       General: alert, cooperative and no distress Lochia: appropriate Uterine Fundus: firm Incision: N/A DVT Evaluation: No evidence of DVT seen on physical exam.  Labs: Lab Results  Component Value Date   WBC 12.8 (H) 05/02/2018   HGB 11.4 (L) 05/02/2018   HCT 34.5 (L) 05/02/2018   MCV 90.1 05/02/2018   PLT 251 05/02/2018    Discharge instruction: per After Visit Summary.  Medications:  Allergies as of 05/02/2018   No Known Allergies     Medication List    TAKE these medications    ibuprofen 600 MG tablet Commonly known as:  ADVIL Take 1 tablet (600 mg total) by mouth every 6 (six) hours as needed for mild pain or moderate pain.   loratadine 5 MG/5ML syrup Commonly known as:  CLARITIN Take 5 mg by mouth daily.   PRENATAL PO Take 1 tablet by mouth daily.       Diet: routine diet  Activity: Advance as tolerated. Pelvic rest for 6 weeks.   Outpatient follow up: Follow-up Information    [facility] Schedule an appointment as soon as possible for a visit in 6 week(s).   Why:  postpartum follow up. Call for earlier visit for concerns. Contact information: [address]  [place] [phone] [phone]             Postpartum contraception: Vasectomy Rhogam Given postpartum: NA Rubella vaccine given postpartum: Rubella Immune Varicella vaccine given postpartum: Varicella Immune TDaP given antepartum or postpartum: given antepartum  Newborn Data: Live born female  Birth Weight: 8  lb, 11 oz APGAR: 8, 9  Newborn Delivery   Birth date/time:  05/01/2018 07:43:00 Delivery type:  Vaginal, Spontaneous      Baby Feeding: Breast  Disposition: home with mother  SIGNED:  Oswaldo Conroy, CNM 05/02/2018

## 2018-05-01 NOTE — Anesthesia Preprocedure Evaluation (Addendum)
Anesthesia Evaluation  Patient identified by MRN, date of birth, ID band Patient awake    Reviewed: Allergy & Precautions, H&P , NPO status , Patient's Chart, lab work & pertinent test results  Airway Mallampati: II  TM Distance: >3 FB     Dental  (+) Teeth Intact   Pulmonary neg pulmonary ROS,           Cardiovascular hypertension (Hypertensive during labor to 140s/90s, UPC ratio 0.25. No prior hx pre-ecclampsia),      Neuro/Psych    GI/Hepatic negative GI ROS,   Endo/Other    Renal/GU   negative genitourinary   Musculoskeletal   Abdominal   Peds  Hematology negative hematology ROS (+)   Anesthesia Other Findings Obese BMI 38  Past Medical History: No date: Medical history non-contributory  Past Surgical History: No date: NO PAST SURGERIES  BMI    Body Mass Index:  38.06 kg/m      Reproductive/Obstetrics (+) Pregnancy                            Anesthesia Physical Anesthesia Plan  ASA: III  Anesthesia Plan: Epidural   Post-op Pain Management:    Induction:   PONV Risk Score and Plan:   Airway Management Planned:   Additional Equipment:   Intra-op Plan:   Post-operative Plan:   Informed Consent: I have reviewed the patients History and Physical, chart, labs and discussed the procedure including the risks, benefits and alternatives for the proposed anesthesia with the patient or authorized representative who has indicated his/her understanding and acceptance.       Plan Discussed with: Anesthesiologist  Anesthesia Plan Comments:         Anesthesia Quick Evaluation

## 2018-05-01 NOTE — Plan of Care (Signed)
Patient and husband oriented to postpartum, discussed fall safety for mom and newborn, breastfeeding and completion of birth certificate worksheet

## 2018-05-01 NOTE — Anesthesia Procedure Notes (Signed)
Epidural Patient location during procedure: OB Start time: 05/01/2018 7:05 AM End time: 05/01/2018 7:34 AM  Staffing Anesthesiologist: Jovita Gamma, MD Performed: anesthesiologist   Preanesthetic Checklist Completed: patient identified, site marked, surgical consent, pre-op evaluation, timeout performed, IV checked, risks and benefits discussed and monitors and equipment checked  Epidural Patient position: sitting Prep: ChloraPrep Patient monitoring: heart rate, continuous pulse ox and blood pressure Approach: midline Location: L3-L4 Injection technique: LOR saline  Needle:  Needle type: Tuohy  Needle gauge: 18 G Needle length: 9 cm and 9 Needle insertion depth: 7 cm Catheter type: closed end flexible Catheter size: 20 Guage Catheter at skin depth: 11 cm Test dose: negative and Other  Assessment Events: blood not aspirated, injection not painful, no injection resistance, negative IV test and no paresthesia  Additional Notes   Patient tolerated the insertion well without complications.Reason for block:procedure for pain

## 2018-05-01 NOTE — Progress Notes (Signed)
  Labor Progress Note   33 y.o. [id number] @ [redacted]w[redacted]d , admitted for  Pregnancy, Labor Management. Induction for GHTN  Subjective:  Feels contractions of moderate intensity  Objective:  BP (!) 124/58   Pulse 83   Temp 98.4 F (36.9 C) (Oral)   Resp 16   Ht 5\' 7"  (1.702 m)   Wt 110.2 kg   LMP 08/11/2017   SpO2 95%   BMI 38.06 kg/m  Abd: gravid, ND, FHT present, mild tenderness on exam Extr: trace to 1+ bilateral pedal edema SVE: CERVIX: 4 cm dilated, 60-70 effaced, -3 station  EFM: FHR: 145 bpm, variability: moderate,  accelerations:  Present,  decelerations:  Present occasional variables and several 3 minute decelerations over the night with good return to baseline and moderate variability throughout Toco: Frequency: Every 2-4 minutes Labs: I have reviewed the patient's lab results.   Assessment & Plan:  [id number] @ [redacted]w[redacted]d, admitted for  Pregnancy and Labor/Delivery Management  1. Pain management: none. 2. FWB: FHT category II and overall reassuring.  3. ID: GBS positive 4. Labor management: pitocin titrated to adequate pattern as tolerated by fetus  All discussed with patient, see orders   Tresea Mall, CNM [facility] 05/01/2018  5:06 AM

## 2018-05-02 LAB — CBC
HCT: 34.5 % — ABNORMAL LOW (ref 36.0–46.0)
Hemoglobin: 11.4 g/dL — ABNORMAL LOW (ref 12.0–15.0)
MCH: 29.8 pg (ref 26.0–34.0)
MCHC: 33 g/dL (ref 30.0–36.0)
MCV: 90.1 fL (ref 80.0–100.0)
Platelets: 251 10*3/uL (ref 150–400)
RBC: 3.83 MIL/uL — ABNORMAL LOW (ref 3.87–5.11)
RDW: 16.1 % — ABNORMAL HIGH (ref 11.5–15.5)
WBC: 12.8 10*3/uL — ABNORMAL HIGH (ref 4.0–10.5)
nRBC: 0 % (ref 0.0–0.2)

## 2018-05-02 LAB — RPR: RPR Ser Ql: NONREACTIVE

## 2018-05-02 MED ORDER — IBUPROFEN 600 MG PO TABS: 600.0000 mg | ORAL_TABLET | Freq: Four times a day (QID) | ORAL | 0 refills | Status: DC | PRN

## 2018-05-02 NOTE — Lactation Note (Signed)
This note was copied from a baby's chart. Lactation Consultation Note  Patient Name: Kelly Kelly [id number] Date: 05/02/2018 Reason for consult: Follow-up assessment;Mother's request   Maternal Data Formula Feeding for Exclusion: No Does the patient have breastfeeding experience prior to this delivery?: Yes  Feeding Feeding Type: Breast Fed Baby had circ this am, not opening mouth wide, mom has large nipples but once baby had wide open mouth mom assisted with getting nipple deeper in mouth and baby started sucking rhythmically with no pain to mom  LATCH Score Latch: Grasps breast easily, tongue down, lips flanged, rhythmical sucking.  Audible Swallowing: A few with stimulation  Type of Nipple: Everted at rest and after stimulation(large nipple)  Comfort (Breast/Nipple): Soft / non-tender  Hold (Positioning): Assistance needed to correctly position infant at breast and maintain latch.  LATCH Score: 8  Interventions Interventions: Assisted with latch;Skin to skin;Hand express;Adjust position;Support pillows  Lactation Tools Discussed/Used WIC Program: No   Consult Status Consult Status: PRN    Dyann Kief 05/02/2018, 12:43 PM

## 2018-05-02 NOTE — Anesthesia Postprocedure Evaluation (Signed)
Anesthesia Post Note  Patient: Associate Professor  Procedure(s) Performed: AN AD HOC LABOR EPIDURAL  Patient location during evaluation: Mother Baby Anesthesia Type: Epidural Level of consciousness: awake and alert Pain management: pain level controlled Vital Signs Assessment: post-procedure vital signs reviewed and stable Respiratory status: spontaneous breathing, nonlabored ventilation and respiratory function stable Cardiovascular status: stable Postop Assessment: no headache, no backache and epidural receding Anesthetic complications: no     Last Vitals:  Vitals:   05/02/18 0414 05/02/18 0741  BP: 110/73 134/75  Pulse: 88 84  Resp: 20 20  Temp: 36.8 C 36.8 C  SpO2: 100% 100%    Last Pain:  Vitals:   05/02/18 0741  TempSrc: Oral  PainSc:                  Jules Schick

## 2018-05-02 NOTE — Discharge Instructions (Signed)
Breast Pumping Tips There may be times when you cannot feed your baby from your breast, such as when you are at work or on a trip. Breast pumping allows you to remove milk from your breast in order to store for later use. There are three ways to pump. You can use:  Your hand to massage and squeeze your breast (hand expression).  A handheld manual pump.  An electric pump. When you first start to pump, you may not get much milk, but after a few days your breasts should start to make more. Pumping can help stimulate your milk supply after your baby is born. It can also help maintain your milk supply when you are away from your baby. When should I pump? You can start pumping soon after your baby is born. Here are some tips on when to pump:  When with your baby: ? Pump after breastfeeding. ? Pump from the free breast while you breastfeed.  When away from your baby: ? Pump every 2-3 hours for about 15 minutes. ? Pump both breasts at the same time if you can.  If your baby gets formula feeding, pump around the time your baby gets that feeding.  If you drank alcohol, wait 2 hours before pumping.  If you are having a procedure with anesthesia, talk to your health care provider about when you should pump before and after. How do I prepare to pump? Take steps to relax. This makes it easier to stimulate your let-down reflex, which is what makes breast milk flow. To help:  Smell one of your infant's blankets or an item of clothing.  Look at a picture or video of your infant.  Sit in a quiet, private space.  Massage your breast and nipple.  Place a warm cloth on your breast. The cloth should be a little wet.  Play relaxing music.  Picture your milk flowing. What are some tips? General tips for pumping breast milk  Always wash your hands before pumping.  If you are not getting very much milk or pumping is uncomfortable, make adjustments to your pump or try using different type of  pumps.  Drink enough fluid to keep your urine clear or pale yellow.  Wear clothing that opens in the front or allows easy access to your breasts.  Pump breast milk directly into clean bottles or other storage containers.  Do not use any products that contain nicotine or tobacco, such as cigarettes and e-cigarettes. These can lower your milk supply and harm your infant. If you need help quitting, ask your health care provider. Tips for storing breast milk  Store breast milk in a clean, BPA-free container, such as glass or plastic bottles or milk storage bags.  Store breast milk in 2-4 ounce batches to reduce waste.  Swirl the breast milk in the container to mix any cream that floats to the top. Do not shake it.  Label all stored milk with the date you pumped it.  The amount of time you can keep breast milk depends on where it is stored: ? Room temperature: 6-8 hours, if the milk is clean. It is best if used within 4 hours. ? Cooler with ice packs: 24 hours. ? Refrigerator: 5-8 days, if the milk is clean. It is best if used within 3 days. ? Freezer: 9-12 months, if the milk is clean and stored away from the freezer door. It is best if used within 6 months.  When using a refrigerator or freezer,  put the milk in the back to keep it as cold as possible.  Thaw frozen milk using warm water. Do not use the microwave. Tips for choosing a breast pump The right pump for you will depend on your comfort and how often you will be away from your baby. When choosing a pump, consider the following:  Manual breast pumps do not need electricity to work. They are usually cheaper than electric pumps, but they can be harder to use. They may be a good choice if you are occasionally away from your baby.  Electric breast pumps are usually more expensive than manual pumps, but they can be easier for some women to use. They can also collect more milk than manual pumps. This makes them a good choice for women  who work in an office or need to be away from their baby for longer periods of time.  The suction cup (flange) should be the right size. If it is the wrong size, it may cause pain and nipple damage.  Before buying a pump, find out whether your insurance covers the cost of a breast pump. Tips for maintaining a breast pump  Check your pump's manual for cleaning tips.  Clean the pump after each use. To do this: ? Wipe down the electrical unit. Use a dry, soft cloth or clean paper towel. Do not put the electrical unit in water or cleaning products. ? Wash the plastic pump parts with soap and warm water or in the dishwasher, if the parts are dishwasher safe. You do not need to clean the tubing unless it comes in contact with breast milk. Let the parts air dry. Avoid drying them with a cloth or towel. ? When the pump parts are clean and dry, put the pump back together. Then store the pump.  If there is water in the tubing when it comes time to pump, attach the tubing to the pump and turn on the pump. Run the pump until the tube is dry.  Avoid touching the inside of pump parts that come in contact with breast milk. Summary  Pumping can help stimulate your milk supply after your baby is born. It can also help maintain your milk supply when you are away from your baby.  When you are away from your infant for several hours, pump for about 15 minutes every 2-3 hours. Pump both breasts at the same time, if you can.  Your health care provider or lactation consultant can help you decide which breast pump is right for you. The right pump for you depends on your comfort, work schedule, and how often you may be away from your baby. This information is not intended to replace advice given to you by your health care provider. Make sure you discuss any questions you have with your health care provider. Document Released: 06/21/2009 Document Revised: 09/20/2017 Document Reviewed: 02/06/2016 Elsevier Interactive  Patient Education  2019 [employer].  Breast Pumping Tips Breast pumping is a way to get milk out of your breasts. You will then store the milk for your baby to use when you are away from home. There are three ways to pump. You can:  Use your hand to massage and squeeze your breast (hand expression).  Use a hand-held machine to manually pump your milk.  Use an electric machine to pump your milk. In the beginning you may not get much milk. After a few days your breasts should make more. Pumping can help you start making  milk after your baby is born. Pumping helps you to keep making milk when you are away from your baby. When should I pump? You can start pumping soon after your baby is born. Follow these tips:  When you are with your baby: ? Pump after you breastfeed. ? Pump from the free breast while you breastfeed.  When you are away from your baby: ? Pump every 2-3 hours for 15 minutes. ? Pump both breasts at the same time if you can.  If your baby drinks formula, pump around the time your baby gets the formula.  If you drank alcohol, wait 2 hours before you pump.  If you are going to have surgery, ask your doctor when you should pump again. How do I get ready to pump? Take steps to relax. Try these things to help your milk come in:  Smell your baby's blanket or clothes.  Look at a picture or video of your baby.  Sit in a quiet, private space.  Massage your breast and nipple.  Place a cloth on your breast. The cloth should be warm and a little wet.  Play relaxing music.  Picture your milk flowing. What are some tips? General tips for pumping breast milk  Always wash your hands before pumping.  If you do not get much milk or if pumping hurts, try different pump settings or a different kind of pump.  Drink enough fluid so your pee (urine) is clear or pale yellow.  Wear clothing that opens in the front or is easy to take off.  Pump milk into a clean bottle or  container.  Do not use anything that has nicotine or tobacco. Examples are cigarettes and e-cigarettes. If you need help quitting, ask your doctor. Tips for storing breast milk  Store breast milk in a clean, BPA-free container. These include: ? A glass or plastic bottle. ? A milk storage bag.  Store only 2-4 ounces of breast milk in each container.  Swirl the breast milk in the container. Do not shake it.  Write down the date you pumped the milk on the container.  This is how long you can store breast milk: ? Room temperature: 6-8 hours. It is best to use the milk within 4 hours. ? Cooler with ice packs: 24 hours. ? Refrigerator: 5-8 days, if the milk is clean. It is best to use the milk within 3 days. ? Freezer: 9-12 months, if the milk is clean and stored away from the freezer door. It is best to use the milk within 6 months.  Put milk in the back of the refrigerator or freezer.  Thaw frozen milk using warm water. Do not use the microwave. Tips for choosing a breast pump When choosing a pump, keep the following things in mind:  Manual breast pumps do not need electricity. They cost less. They can be hard to use.  Electric breast pumps use electricity. They are more expensive. They are easier to use. They collect more milk.  The suction cup (flange) should be the right size.  Before you buy the pump, check if your insurance will pay for it. Tips for caring for a breast pump  Check the manual that came with your pump for cleaning tips.  Clean the pump after you use it. To do this: 1. Wipe down the electrical part. Use a dry cloth or paper towel. Do not put this part in water or in cleaning products. 2. Wash the plastic parts with soap  and warm water. Or use the dishwasher if the manual says it is safe. You do not need to clean the tubing unless it touched breast milk. 3. Let all the parts air dry. Avoid drying them with a cloth or towel. 4. When the parts are clean and dry,  put the pump back together. Then store the pump.  If there is water in the tubing when you want to pump: 1. Attach the tubing to the pump. 2. Turn on the pump. 3. Turn off the pump when the tube is dry.  Try not to touch the inside of pump parts. Summary  Pumping can help you start making milk after your baby is born. It lets you keep making milk when you are away from your baby.  When you are away from your baby, pump for about 15 minutes every 2-3 hours. Pump both breasts at the same time, if you can. This information is not intended to replace advice given to you by your health care provider. Make sure you discuss any questions you have with your health care provider. Document Released: 06/20/2007 Document Revised: 02/06/2016 Document Reviewed: 02/06/2016 Elsevier Interactive Patient Education  2019 [employer].   Breastfeeding Tips for a Good Latch Latching is how your baby's mouth attaches to your nipple to breastfeed. It is an important part of breastfeeding. Your baby may have trouble latching for a number of reasons. A poor latch may cause you to have cracked or sore nipples or other problems. Follow these instructions at home: How to position your baby  Find a comfortable place to sit or lie down. Your neck and back should be well supported.  If you are seated, place a pillow or rolled-up blanket under your baby. This will bring him or her to the level of your breast.  Make sure that your baby's belly (abdomen) is facing your belly.  Try different positions to find one that works best for you and your baby. How to help your baby latch   To start, gently rub your breast. Move your fingertips in a circle as you massage from your chest wall toward your nipple. This helps milk flow. Keep doing this during feeding if needed.  Position your breast. Hold your breast with four fingers underneath and your thumb above your nipple. Keep your fingers away from your nipple and your  baby's mouth. Follow these steps to help your baby latch: 1. Rub your baby's lips gently with your finger or nipple. 2. When your baby's mouth is open wide enough, quickly bring your baby to your breast and place your whole nipple into your baby's mouth. Place as much of the colored area around your nipple (areola)as possible into your baby's mouth. 3. Your baby's tongue should be between his or her lower gum and your breast. 4. You should be able to see more areola above your baby's upper lip than below the lower lip. 5. When your baby starts sucking, you will feel a gentle pull on your nipple. You should not feel any pain. Be patient. It is common for a baby to suck for about 2-3 minutes to start the flow of breast milk. 6. Make sure that your baby's mouth is in the right position around your nipple. Your baby's lips should make a seal on your breast and be turned outward.  General instructions  Look for these signs that your baby has latched on to your nipple: ? The baby is quietly tugging or sucking without causing  you pain. ? You hear the baby swallow after every 3 or 4 sucks. ? You see movement above and in front of the baby's ears while he or she is sucking.  Be aware of these signs that your baby has not latched on to your nipple: ? The baby makes sucking sounds or smacking sounds while feeding. ? You have nipple pain.  If your baby is not latched well, put your little finger between your baby's gums and your nipple. This will break the seal. Then try to help your baby latch again.  If you keep having problems, get help from a breastfeeding specialist (Advertising copywriter). Contact a doctor if:  You have cracking or soreness in your nipples that lasts longer than 1 week.  You have nipple pain.  Your breasts are filled with too much milk (engorgement), and this does not improve after 48-72 hours.  You have a plugged milk duct and a fever.  You follow the tips for a good latch  but you keep having problems or concerns.  You have a pus-like fluid coming from your breast.  Your baby is not gaining weight.  Your baby loses weight. Summary  Latching is how your baby's mouth attaches to your nipple to breastfeed.  Try different positions for breastfeeding to find one that works best for you and your baby.  A poor latch may cause you to have cracked or sore nipples or other problems. This information is not intended to replace advice given to you by your health care provider. Make sure you discuss any questions you have with your health care provider. Document Released: 08/08/2016 Document Revised: 08/08/2016 Document Reviewed: 08/08/2016 Elsevier Interactive Patient Education  2019 [employer].

## 2018-05-02 NOTE — Progress Notes (Signed)
Dc to home.  Verb u/o of f/u care and appointment and med.  Reviewed red warning signs to notify MD for.  To car via Insurance underwriter.

## 2018-07-10 DIAGNOSIS — R030 Elevated blood-pressure reading, without diagnosis of hypertension: Secondary | ICD-10-CM

## 2018-07-10 DIAGNOSIS — F4322 Adjustment disorder with anxiety: Secondary | ICD-10-CM

## 2018-07-14 ENCOUNTER — Other Ambulatory Visit: Payer: Self-pay

## 2018-07-14 ENCOUNTER — Ambulatory Visit: Payer: Self-pay | Admitting: Licensed Clinical Social Worker

## 2018-07-14 DIAGNOSIS — F4322 Adjustment disorder with anxiety: Secondary | ICD-10-CM

## 2018-07-14 NOTE — Progress Notes (Signed)
Counselor/Therapist Progress Note  Patient ID: Kelly Kelly, MRN: [id number],    Date: 07/14/2018  Time Spent: 30 minutes    Treatment Type: Psychotherapy  Reported Symptoms: anxiousness  Mental Status Exam:  Appearance:   NA     Behavior:  Appropriate  Motor:  NA  Speech/Language:   Normal Rate  Affect:  Flat  Mood:  normal  Thought process:  normal  Thought content:    WNL  Sensory/Perceptual disturbances:    WNL  Orientation:  oriented to person, place and time/date  Attention:  Good  Concentration:  Good  Memory:  WNL  Fund of knowledge:   Good  Insight:    Good  Judgment:   Good  Impulse Control:  Good   Risk Assessment: Danger to Self:  No Self-injurious Behavior: No Danger to Others: No Duty to Warn:no Physical Aggression / Violence:No  Access to Firearms a concern: No  Gang Involvement:No   Subjective: Patient participated minimally throughout the session discussing current psychosocial stressors- marital conflict and co-parenting challenges.  Patient reports continued symptoms of anxiety which worsen with multiple stressors. She plans to do Triple P online and wishes to terminate further services at this time.   Interventions: Cognitive Behavioral Therapy, Psycho-education/Bibliotherapy and Triple P. Engaged patient in processing current psychosocial stressors, validating patient's feelings of anxiousness. Reviewed use of mindfulness.  Discussed and provided information for patient to engage in Triple P online. Reviewed treatment plan and discussed termination of services. Taught patient about values activity as a helpful tool in developing a self-care plan. Encouraged patient to reconsider  medication evaluation from PCP.   Diagnosis:   ICD-10-CM   1. Adjustment disorder with anxiety  F43.22     Plan: Patient to seek services as needed.   Milton Ferguson, LCSW

## 2019-06-02 ENCOUNTER — Ambulatory Visit: Payer: BC Managed Care – PPO | Admitting: Adult Health

## 2019-06-02 NOTE — Progress Notes (Deleted)
New patient visit   Patient: Kelly Kelly   DOB: Sep 15, 1985   34 y.o. Female  MRN: [id number] Visit Date: 06/02/2019  Today's healthcare provider: Marcille Buffy, FNP   No chief complaint on file.  Subjective    Kelly Kelly is a 34 y.o. female who presents today as a new patient to establish care.  HPI  ***  Past Medical History:  Diagnosis Date  . Medical history non-contributory    Past Surgical History:  Procedure Laterality Date  . NO PAST SURGERIES     Family Status  Relation Name Status  . Mat Uncle  (Not Specified)  . Annamarie Major  (Not Specified)  . MGM  (Not Specified)   Family History  Problem Relation Age of Onset  . Diabetes Maternal Uncle   . Cancer Paternal Uncle   . Cancer Maternal Grandmother   . Diabetes Maternal Grandmother    Social History   Socioeconomic History  . Marital status: Married    Spouse name: Vonna Kotyk  . Number of children: Not on file  . Years of education: Not on file  . Highest education level: Not on file  Occupational History  . Not on file  Tobacco Use  . Smoking status: Never Smoker  . Smokeless tobacco: Never Used  Substance and Sexual Activity  . Alcohol use: No  . Drug use: No  . Sexual activity: Yes    Birth control/protection: Other-see comments    Comment: vasectomy  Other Topics Concern  . Not on file  Social History Narrative  . Not on file   Social Determinants of Health   Financial Resource Strain:   . Difficulty of Paying Living Expenses:   Food Insecurity:   . Worried About Charity fundraiser in the Last Year:   . Arboriculturist in the Last Year:   Transportation Needs:   . Film/video editor (Medical):   Marland Kitchen Lack of Transportation (Non-Medical):   Physical Activity:   . Days of Exercise per Week:   . Minutes of Exercise per Session:   Stress:   . Feeling of Stress :   Social Connections:   . Frequency of Communication with Friends and Family:   . Frequency of  Social Gatherings with Friends and Family:   . Attends Religious Services:   . Active Member of Clubs or Organizations:   . Attends Archivist Meetings:   Marland Kitchen Marital Status:    Outpatient Medications Prior to Visit  Medication Sig  . ibuprofen (ADVIL) 600 MG tablet Take 1 tablet (600 mg total) by mouth every 6 (six) hours as needed for mild pain or moderate pain.  Marland Kitchen loratadine (CLARITIN) 5 MG/5ML syrup Take 5 mg by mouth daily.  . Prenatal Vit-Fe Fumarate-FA (PRENATAL PO) Take 1 tablet by mouth daily.   No facility-administered medications prior to visit.   No Known Allergies  Immunization History  Administered Date(s) Administered  . Influenza,inj,Quad PF,6+ Mos 02/16/2016  . Tdap 05/10/2016    Health Maintenance  Topic Date Due  . COVID-19 Vaccine (1) Never done  . PAP SMEAR-Modifier  Never done  . INFLUENZA VACCINE  08/16/2019  . TETANUS/TDAP  05/11/2026  . HIV Screening  Completed    Patient Care Team: Patient, No Pcp Per as PCP - General (General Practice)  Review of Systems  All other systems reviewed and are negative.   {Show previous labs (optional):23779::" "}  Objective    There were no vitals  taken for this visit. Physical Exam ***  Depression Screen No flowsheet data found. No results found for any visits on 06/02/19.  Assessment & Plan     ***  No follow-ups on file.     {provider attestation***:1}   Jairo Ben, FNP  [place] [phone] (phone) [phone] (fax)  [facility]

## 2019-07-15 ENCOUNTER — Other Ambulatory Visit: Payer: Self-pay

## 2019-07-15 ENCOUNTER — Encounter: Payer: Self-pay | Admitting: Adult Health

## 2019-07-15 ENCOUNTER — Ambulatory Visit: Payer: BC Managed Care – PPO | Admitting: Adult Health

## 2019-07-15 VITALS — BP 118/60 | HR 77 | Temp 96.6°F | Resp 16 | Ht 67.0 in | Wt 184.2 lb

## 2019-07-15 DIAGNOSIS — Z8719 Personal history of other diseases of the digestive system: Secondary | ICD-10-CM

## 2019-07-15 DIAGNOSIS — K644 Residual hemorrhoidal skin tags: Secondary | ICD-10-CM

## 2019-07-15 DIAGNOSIS — F419 Anxiety disorder, unspecified: Secondary | ICD-10-CM | POA: Diagnosis not present

## 2019-07-15 DIAGNOSIS — Z0000 Encounter for general adult medical examination without abnormal findings: Secondary | ICD-10-CM

## 2019-07-15 DIAGNOSIS — Z13220 Encounter for screening for lipoid disorders: Secondary | ICD-10-CM | POA: Diagnosis not present

## 2019-07-15 DIAGNOSIS — Z6828 Body mass index (BMI) 28.0-28.9, adult: Secondary | ICD-10-CM

## 2019-07-15 NOTE — Progress Notes (Addendum)
New patient visit   Patient: Kelly Kelly   DOB: [birth year]   34 y.o. Female  MRN: [id number] Visit Date: 07/15/2019  Today's healthcare provider: Jairo BenMichelle Smith Daniella Dewberry, FNP   Chief Complaint  Patient presents with  . New Patient (Initial Visit)   Subjective    Kelly Kelly is a 34 y.o. female who presents today as a new patient to establish care.  HPI  Patient would like to address symptoms of anxiety that she reports that she has been suffering with for years, patient states  that she has not been previously treated for anxiety but had been evaluated previously at health department. Patient reports that she has 3 children and denies that she is under any increased amount of stress, patient has not sought out counseling in past but is open to it now.   Patient reports history of hemorrhoids and constipation but denies any rectal bleeding or abdominal pain. Patient states her two recent pregnancy were in 2018 and 2020 bout vaginal deliveries, she states that last pap examination was at health department in 2019 and was normal at that time.    She denies any suicidal thoughts or intents or homicidal thoughts or intents now or in past.   Chronic constipation, diet not high in fiber. denies any bleeding or pain rectally or any melena. Stools 1- 3 times per weak.   Patient  denies any fever, body aches,chills, rash, chest pain, shortness of breath, nausea, vomiting, or diarrhea.    Denies dizziness, lightheadedness, pre syncopal or syncopal episodes.   Past Medical History:  Diagnosis Date  . Anxiety   . Medical history non-contributory    Past Surgical History:  Procedure Laterality Date  . NO PAST SURGERIES     Family Status  Relation Name Status  . Mat Uncle  (Not Specified)  . Oneal GroutPat Uncle  (Not Specified)  . MGM  (Not Specified)  . Mother  (Not Specified)  . Father  (Not Specified)  . Son  (Not Specified)  . MGF  (Not Specified)   Family History    Problem Relation Age of Onset  . Diabetes Maternal Uncle   . Cancer Paternal Uncle   . Cancer Maternal Grandmother   . Diabetes Maternal Grandmother   . Hypertension Maternal Grandmother   . Congestive Heart Failure Maternal Grandmother   . Hypertension Mother   . Vitamin D deficiency Mother   . Endometriosis Mother   . Asthma Mother   . Hyperlipidemia Father   . Sickle cell trait Son   . Hypertension Maternal Grandfather    Social History   Socioeconomic History  . Marital status: Married    Spouse name: Ivin BootyJoshua  . Number of children: Not on file  . Years of education: Not on file  . Highest education level: Not on file  Occupational History  . Not on file  Tobacco Use  . Smoking status: Never Smoker  . Smokeless tobacco: Never Used  Vaping Use  . Vaping Use: Never used  Substance and Sexual Activity  . Alcohol use: Yes  . Drug use: No  . Sexual activity: Yes    Birth control/protection: Other-see comments    Comment: vasectomy  Other Topics Concern  . Not on file  Social History Narrative  . Not on file   Social Determinants of Health   Financial Resource Strain:   . Difficulty of Paying Living Expenses:   Food Insecurity:   . Worried About Programme researcher, broadcasting/film/videounning Out of Food in  the Last Year:   . Ran Out of Food in the Last Year:   Transportation Needs:   . Freight forwarder (Medical):   Marland Kitchen Lack of Transportation (Non-Medical):   Physical Activity:   . Days of Exercise per Week:   . Minutes of Exercise per Session:   Stress:   . Feeling of Stress :   Social Connections:   . Frequency of Communication with Friends and Family:   . Frequency of Social Gatherings with Friends and Family:   . Attends Religious Services:   . Active Member of Clubs or Organizations:   . Attends Banker Meetings:   Marland Kitchen Marital Status:    Outpatient Medications Prior to Visit  Medication Sig  . ibuprofen (ADVIL) 600 MG tablet Take 1 tablet (600 mg total) by mouth every 6  (six) hours as needed for mild pain or moderate pain.  Marland Kitchen loratadine (CLARITIN) 5 MG/5ML syrup Take 5 mg by mouth daily.  . Prenatal Vit-Fe Fumarate-FA (PRENATAL PO) Take 1 tablet by mouth daily.   No facility-administered medications prior to visit.   No Known Allergies  Immunization History  Administered Date(s) Administered  . Influenza,inj,Quad PF,6+ Mos 02/16/2016  . Tdap 05/10/2016    Health Maintenance  Topic Date Due  . Hepatitis C Screening  Never done  . COVID-19 Vaccine (1) Never done  . PAP SMEAR-Modifier  Never done  . INFLUENZA VACCINE  08/16/2019  . TETANUS/TDAP  05/11/2026  . HIV Screening  Completed    Patient Care Team: Andric Kerce, Eula Fried, FNP as PCP - General (Family Medicine)  Review of Systems  Constitutional: Negative.   HENT: Negative.   Respiratory: Negative.   Gastrointestinal: Positive for constipation. Negative for abdominal distention, abdominal pain, anal bleeding, blood in stool, diarrhea, nausea, rectal pain and vomiting.  Musculoskeletal: Negative.   Psychiatric/Behavioral: Positive for decreased concentration. Negative for agitation, behavioral problems, confusion, dysphoric mood, hallucinations, self-injury, sleep disturbance and suicidal ideas. The patient is nervous/anxious. The patient is not hyperactive.     Last CBC Lab Results  Component Value Date   WBC 12.8 (H) 05/02/2018   HGB 11.4 (L) 05/02/2018   HCT 34.5 (L) 05/02/2018   MCV 90.1 05/02/2018   MCH 29.8 05/02/2018   RDW 16.1 (H) 05/02/2018   PLT 251 05/02/2018   Last metabolic panel Lab Results  Component Value Date   GLUCOSE 100 (H) 04/30/2018   NA 134 (L) 04/30/2018   K 3.7 04/30/2018   CL 106 04/30/2018   CO2 16 (L) 04/30/2018   BUN 13 04/30/2018   CREATININE 0.90 04/30/2018   GFRNONAA >60 04/30/2018   GFRAA >60 04/30/2018   CALCIUM 9.2 04/30/2018   PROT 7.7 04/30/2018   ALBUMIN 3.1 (L) 04/30/2018   BILITOT 0.3 04/30/2018   ALKPHOS 208 (H) 04/30/2018   AST  16 04/30/2018   ALT 10 04/30/2018   ANIONGAP 12 04/30/2018   Last lipids No results found for: CHOL, HDL, LDLCALC, LDLDIRECT, TRIG, CHOLHDL Last hemoglobin A1c No results found for: HGBA1C Last thyroid functions No results found for: TSH, T3TOTAL, T4TOTAL, THYROIDAB Last vitamin D No results found for: 25OHVITD2, 25OHVITD3, VD25OH Last vitamin B12 and Folate No results found for: VITAMINB12, FOLATE    Objective    BP 118/60   Pulse 77   Temp (!) 96.6 F (35.9 C) (Oral)   Resp 16   Ht  (1.702 m)   Wt 184 lb 3.2 oz (83.6 kg)   LMP  (Within  Weeks)   SpO2 100%   Breastfeeding Yes   BMI 28.85 kg/m  Physical Exam Vitals reviewed. Exam conducted with a chaperone present.  Constitutional:      General: She is not in acute distress.    Appearance: Normal appearance. She is well-developed and normal weight. She is not ill-appearing, toxic-appearing or diaphoretic.     Interventions: She is not intubated.    Comments: Patient appers well, not sickly. Speaking in complete sentences. Patient moves on and off of exam table and in room without difficulty. Gait is normal in hall and in room. Patient is oriented to person place time and situation. Patient answers questions appropriately and engages eye contact and verbal dialect with provider.   HENT:     Head: Normocephalic and atraumatic.     Right Ear: External ear normal.     Left Ear: External ear normal.     Nose: Nose normal.     Mouth/Throat:     Pharynx: No oropharyngeal exudate.  Eyes:     General: Lids are normal. No scleral icterus.       Right eye: No discharge.        Left eye: No discharge.     Extraocular Movements: Extraocular movements intact.     Conjunctiva/sclera: Conjunctivae normal.     Right eye: Right conjunctiva is not injected. No exudate or hemorrhage.    Left eye: Left conjunctiva is not injected. No exudate or hemorrhage.    Pupils: Pupils are equal, round, and reactive to light.  Neck:      Thyroid: No thyroid mass or thyromegaly.     Vascular: Normal carotid pulses. No carotid bruit, hepatojugular reflux or JVD.     Trachea: Trachea and phonation normal. No tracheal tenderness or tracheal deviation.     Meningeal: Brudzinski's sign and Kernig's sign absent.  Cardiovascular:     Rate and Rhythm: Normal rate and regular rhythm.     Pulses: Normal pulses.          Radial pulses are 2+ on the right side and 2+ on the left side.       Dorsalis pedis pulses are 2+ on the right side and 2+ on the left side.       Posterior tibial pulses are 2+ on the right side and 2+ on the left side.     Heart sounds: Normal heart sounds, S1 normal and S2 normal. Heart sounds not distant. No murmur heard.  No friction rub. No gallop.   Pulmonary:     Effort: Pulmonary effort is normal. No tachypnea, bradypnea, accessory muscle usage or respiratory distress. She is not intubated.     Breath sounds: Normal breath sounds. No stridor. No wheezing, rhonchi or rales.  Chest:     Chest wall: No tenderness.     Breasts: Tanner Score is 5.        Right: Normal. No inverted nipple, nipple discharge, skin change or tenderness.        Left: Normal. No inverted nipple, nipple discharge, skin change or tenderness.  Abdominal:     General: Bowel sounds are normal. There is no distension or abdominal bruit.     Palpations: Abdomen is soft. There is no shifting dullness, fluid wave, hepatomegaly, splenomegaly, mass or pulsatile mass.     Tenderness: There is no abdominal tenderness. There is no guarding or rebound.     Hernia: No hernia is present.  Genitourinary:    Exam position: Supine.  Rectum: No tenderness, anal fissure, external hemorrhoid or internal hemorrhoid. Normal anal tone.       Comments: 3 flesh colored skin tags near rectum that are approximately each 10 mm in size  tags present. No skin irritation. Flesh colored skin tags as documented.  guaiac no stool not performed.  Musculoskeletal:         General: No swelling, tenderness, deformity or signs of injury. Normal range of motion.     Cervical back: Full passive range of motion without pain, normal range of motion and neck supple. No edema, erythema, rigidity or tenderness. No spinous process tenderness or muscular tenderness. Normal range of motion.     Right lower leg: No edema.     Left lower leg: No edema.  Lymphadenopathy:     Head:     Right side of head: No submental, submandibular, tonsillar, preauricular, posterior auricular or occipital adenopathy.     Left side of head: No submental, submandibular, tonsillar, preauricular, posterior auricular or occipital adenopathy.     Cervical: No cervical adenopathy.     Right cervical: No superficial, deep or posterior cervical adenopathy.    Left cervical: No superficial, deep or posterior cervical adenopathy.     Upper Body:     Right upper body: No supraclavicular, axillary or pectoral adenopathy.     Left upper body: No supraclavicular, axillary or pectoral adenopathy.  Skin:    General: Skin is warm and dry.     Capillary Refill: Capillary refill takes less than 2 seconds.     Coloration: Skin is not jaundiced or pale.     Findings: No abrasion, bruising, burn, ecchymosis, erythema, lesion, petechiae or rash.     Nails: There is no clubbing.  Neurological:     General: No focal deficit present.     Mental Status: She is alert and oriented to person, place, and time. Mental status is at baseline.     GCS: GCS eye subscore is 4. GCS verbal subscore is 5. GCS motor subscore is 6.     Cranial Nerves: No cranial nerve deficit.     Sensory: No sensory deficit.     Motor: No weakness, tremor, atrophy, abnormal muscle tone or seizure activity.     Coordination: Coordination normal.     Gait: Gait normal.     Deep Tendon Reflexes: Reflexes are normal and symmetric. Reflexes normal. Babinski sign absent on the right side. Babinski sign absent on the left side.     Reflex  Scores:      Tricep reflexes are 2+ on the right side and 2+ on the left side.      Bicep reflexes are 2+ on the right side and 2+ on the left side.      Brachioradialis reflexes are 2+ on the right side and 2+ on the left side.      Patellar reflexes are 2+ on the right side and 2+ on the left side.      Achilles reflexes are 2+ on the right side and 2+ on the left side. Psychiatric:        Attention and Perception: Attention normal.        Mood and Affect: Mood normal.        Speech: Speech normal.        Behavior: Behavior normal. Behavior is cooperative.        Thought Content: Thought content normal.        Cognition and Memory: Cognition normal.  Judgment: Judgment normal.    GAD -7  Score 12   Depression Screen PHQ 2/9 Scores 07/15/2019  PHQ - 2 Score 1  PHQ- 9 Score 3   No results found for any visits on 07/15/19.  Assessment & Plan       1. Annual physical exam  - CBC with Differential/Platelet - Comprehensive metabolic panel - TSH - Lipid panel - VITAMIN D 25 Hydroxy (Vit-D Deficiency, Fractures)  2. Anxiety She will see psychiatry and counseling. She is lactating, conservative management trial recommended initially.  - CBC with Differential/Platelet - Comprehensive metabolic panel - TSH - VITAMIN D 25 Hydroxy (Vit-D Deficiency, Fractures) - Ambulatory referral to Psychology  3. Screening for lipid disorders  - Lipid panel  4. Body mass index 28.0-28.9, adult   5. Lactating mother Discussed medications/ lactation. Breast exam deterred, denies any issues with self breast exams.   6. Anal skin tag From previous hemorrhoids previously, present for years, do irritate her at times, become sore, recommend Gastroenterology follow up/ evaluation. - she declines at this time.   7. History of hemorrhoids- after childbirth  Same as above. No active or thrombosed notes. None on internal exam.    Constipation diet and adding fiber  Discussed. She also  has been advised about Metamucil use per package instructions to try. Return if not resolved.   The patient is advised to continue current healthy lifestyle patterns and return for routine annual checkups    Return in about 3 months (around 10/15/2019), or if symptoms worsen or fail to improve, for anxiety.      Red Flags discussed. The patient was given clear instructions to go to ER or return to medical center if any red flags develop, symptoms do not improve, worsen or new problems develop. They verbalized understanding.  Advised patient call the office or your primary care doctor for an appointment if no improvement within 72 hours or if any symptoms change or worsen at any time  Advised ER or urgent Care if after hours or on weekend. Call 911 for emergency symptoms at any time.Patinet verbalized understanding of all instructions given/reviewed and treatment plan and has no further questions or concerns at this time.      IBeverely Pace Catrina Fellenz, FNP, have reviewed all documentation for this visit. The documentation on 07/17/19 for the exam, diagnosis, procedures, and orders are all accurate and complete.   Jairo Ben, FNP  [facility] [phone] (phone) [phone] (fax)  [facility]

## 2019-07-15 NOTE — Patient Instructions (Addendum)
Health Maintenance, Female Adopting a healthy lifestyle and getting preventive care are important in promoting health and wellness. Ask your health care provider about:  The right schedule for you to have regular tests and exams.  Things you can do on your own to prevent diseases and keep yourself healthy. What should I know about diet, weight, and exercise? Eat a healthy diet   Eat a diet that includes plenty of vegetables, fruits, low-fat dairy products, and lean protein.  Do not eat a lot of foods that are high in solid fats, added sugars, or sodium. Maintain a healthy weight Body mass index (BMI) is used to identify weight problems. It estimates body fat based on height and weight. Your health care provider can help determine your BMI and help you achieve or maintain a healthy weight. Get regular exercise Get regular exercise. This is one of the most important things you can do for your health. Most adults should:  Exercise for at least 150 minutes each week. The exercise should increase your heart rate and make you sweat (moderate-intensity exercise).  Do strengthening exercises at least twice a week. This is in addition to the moderate-intensity exercise.  Spend less time sitting. Even light physical activity can be beneficial. Watch cholesterol and blood lipids Have your blood tested for lipids and cholesterol at 32 years of age, then have this test every 5 years. Have your cholesterol levels checked more often if:  Your lipid or cholesterol levels are high.  You are older than 32 years of age.  You are at high risk for heart disease. What should I know about cancer screening? Depending on your health history and family history, you may need to have cancer screening at various ages. This may include screening for:  Breast cancer.  Cervical cancer.  Colorectal cancer.  Skin cancer.  Lung cancer. What should I know about heart disease, diabetes, and high blood  pressure? Blood pressure and heart disease  High blood pressure causes heart disease and increases the risk of stroke. This is more likely to develop in people who have high blood pressure readings, are of African descent, or are overweight.  Have your blood pressure checked: ? Every 3-5 years if you are 18-39 years of age. ? Every year if you are 40 years old or older. Diabetes Have regular diabetes screenings. This checks your fasting blood sugar level. Have the screening done:  Once every three years after age 40 if you are at a normal weight and have a low risk for diabetes.  More often and at a younger age if you are overweight or have a high risk for diabetes. What should I know about preventing infection? Hepatitis B If you have a higher risk for hepatitis B, you should be screened for this virus. Talk with your health care provider to find out if you are at risk for hepatitis B infection. Hepatitis C Testing is recommended for:  Everyone born from 1945 through 1965.  Anyone with known risk factors for hepatitis C. Sexually transmitted infections (STIs)  Get screened for STIs, including gonorrhea and chlamydia, if: ? You are sexually active and are younger than 32 years of age. ? You are older than 32 years of age and your health care provider tells you that you are at risk for this type of infection. ? Your sexual activity has changed since you were last screened, and you are at increased risk for chlamydia or gonorrhea. Ask your health care provider if   you are at risk.  Ask your health care provider about whether you are at high risk for HIV. Your health care provider may recommend a prescription medicine to help prevent HIV infection. If you choose to take medicine to prevent HIV, you should first get tested for HIV. You should then be tested every 3 months for as long as you are taking the medicine. Pregnancy  If you are about to stop having your period (premenopausal) and  you may become pregnant, seek counseling before you get pregnant.  Take 400 to 800 micrograms (mcg) of folic acid every day if you become pregnant.  Ask for birth control (contraception) if you want to prevent pregnancy. Osteoporosis and menopause Osteoporosis is a disease in which the bones lose minerals and strength with aging. This can result in bone fractures. If you are 29 years old or older, or if you are at risk for osteoporosis and fractures, ask your health care provider if you should:  Be screened for bone loss.  Take a calcium or vitamin D supplement to lower your risk of fractures.  Be given hormone replacement therapy (HRT) to treat symptoms of menopause. Follow these instructions at home: Lifestyle  Do not use any products that contain nicotine or tobacco, such as cigarettes, e-cigarettes, and chewing tobacco. If you need help quitting, ask your health care provider.  Do not use street drugs.  Do not share needles.  Ask your health care provider for help if you need support or information about quitting drugs. Alcohol use  Do not drink alcohol if: ? Your health care provider tells you not to drink. ? You are pregnant, may be pregnant, or are planning to become pregnant.  If you drink alcohol: ? Limit how much you use to 0-1 drink a day. ? Limit intake if you are breastfeeding.  Be aware of how much alcohol is in your drink. In the [place] one drink equals one 12 oz bottle of beer (355 mL), one 5 oz glass of wine (148 mL), or one 1 oz glass of hard liquor (44 mL). General instructions  Schedule regular health, dental, and eye exams.  Stay current with your vaccines.  Tell your health care provider if: ? You often feel depressed. ? You have ever been abused or do not feel safe at home. Summary  Adopting a healthy lifestyle and getting preventive care are important in promoting health and wellness.  Follow your health care provider's instructions about healthy  diet, exercising, and getting tested or screened for diseases.  Follow your health care provider's instructions on monitoring your cholesterol and blood pressure. This information is not intended to replace advice given to you by your health care provider. Make sure you discuss any questions you have with your health care provider. Document Revised: 12/25/2017 Document Reviewed: 12/25/2017 Elsevier Patient Education  2020 Elsevier Inc.  Breast Self-Awareness Breast self-awareness is knowing how your breasts look and feel. Doing breast self-awareness is important. It allows you to catch a breast problem early while it is still small and can be treated. All women should do breast self-awareness, including women who have had breast implants. Tell your doctor if you notice a change in your breasts. What you need:  A mirror.  A well-lit room. How to do a breast self-exam A breast self-exam is one way to learn what is normal for your breasts and to check for changes. To do a breast self-exam: Look for changes  1. Take off all the  clothes above your waist. 2. Stand in front of a mirror in a room with good lighting. 3. Put your hands on your hips. 4. Push your hands down. 5. Look at your breasts and nipples in the mirror to see if one breast or nipple looks different from the other. Check to see if: ? The shape of one breast is different. ? The size of one breast is different. ? There are wrinkles, dips, and bumps in one breast and not the other. 6. Look at each breast for changes in the skin, such as: ? Redness. ? Scaly areas. 7. Look for changes in your nipples, such as: ? Liquid around the nipples. ? Bleeding. ? Dimpling. ? Redness. ? A change in where the nipples are. Feel for changes  1. Lie on your back on the floor. 2. Feel each breast. To do this, follow these steps: ? Pick a breast to feel. ? Put the arm closest to that breast above your head. ? Use your other arm to feel  the nipple area of your breast. Feel the area with the pads of your three middle fingers by making small circles with your fingers. For the first circle, press lightly. For the second circle, press harder. For the third circle, press even harder. ? Keep making circles with your fingers at the different pressures as you move down your breast. Stop when you feel your ribs. ? Move your fingers a little toward the center of your body. ? Start making circles with your fingers again, this time going up until you reach your collarbone. ? Keep making up-and-down circles until you reach your armpit. Remember to keep using the three pressures. ? Feel the other breast in the same way. 3. Sit or stand in the tub or shower. 4. With soapy water on your skin, feel each breast the same way you did in step 2 when you were lying on the floor. Write down what you find Writing down what you find can help you remember what to tell your doctor. Write down:  What is normal for each breast.  Any changes you find in each breast, including: ? The kind of changes you find. ? Whether you have pain. ? Size and location of any lumps.  When you last had your menstrual period. General tips  Check your breasts every month.  If you are breastfeeding, the best time to check your breasts is after you feed your baby or after you use a breast pump.  If you get menstrual periods, the best time to check your breasts is 5-7 days after your menstrual period is over.  With time, you will become comfortable with the self-exam, and you will begin to know if there are changes in your breasts. Contact a doctor if you:  See a change in the shape or size of your breasts or nipples.  See a change in the skin of your breast or nipples, such as red or scaly skin.  Have fluid coming from your nipples that is not normal.  Find a lump or thick area that was not there before.  Have pain in your breasts.  Have any concerns about  your breast health. Summary  Breast self-awareness includes looking for changes in your breasts, as well as feeling for changes within your breasts.  Breast self-awareness should be done in front of a mirror in a well-lit room.  You should check your breasts every month. If you get menstrual periods, the best time  to check your breasts is 5-7 days after your menstrual period is over.  Let your doctor know of any changes you see in your breasts, including changes in size, changes on the skin, pain or tenderness, or fluid from your nipples that is not normal. This information is not intended to replace advice given to you by your health care provider. Make sure you discuss any questions you have with your health care provider. Document Revised: 08/20/2017 Document Reviewed: 08/20/2017 Elsevier Patient Education  2020 [employer].   Constipation, Adult Constipation is when a person:  Poops (has a bowel movement) fewer times in a week than normal.  Has a hard time pooping.  Has poop that is dry, hard, or bigger than normal. Follow these instructions at home: Eating and drinking   Eat foods that have a lot of fiber, such as: ? Fresh fruits and vegetables. ? Whole grains. ? Beans.  Eat less of foods that are high in fat, low in fiber, or overly processed, such as: ? [place] fries. ? Hamburgers. ? Cookies. ? Candy. ? Soda.  Drink enough fluid to keep your pee (urine) clear or pale yellow. General instructions  Exercise regularly or as told by your doctor.  Go to the restroom when you feel like you need to poop. Do not hold it in.  Take over-the-counter and prescription medicines only as told by your doctor. These include any fiber supplements.  Do pelvic floor retraining exercises, such as: ? Doing deep breathing while relaxing your lower belly (abdomen). ? Relaxing your pelvic floor while pooping.  Watch your condition for any changes.  Keep all follow-up visits as  told by your doctor. This is important. Contact a doctor if:  You have pain that gets worse.  You have a fever.  You have not pooped for 4 days.  You throw up (vomit).  You are not hungry.  You lose weight.  You are bleeding from the anus.  You have thin, pencil-like poop (stool). Get help right away if:  You have a fever, and your symptoms suddenly get worse.  You leak poop or have blood in your poop.  Your belly feels hard or bigger than normal (is bloated).  You have very bad belly pain.  You feel dizzy or you faint. This information is not intended to replace advice given to you by your health care provider. Make sure you discuss any questions you have with your health care provider. Document Revised: 12/14/2016 Document Reviewed: 06/22/2015 Elsevier Patient Education  2020 Elsevier Inc.   Managing Anxiety, Adult After being diagnosed with an anxiety disorder, you may be relieved to know why you have felt or behaved a certain way. You may also feel overwhelmed about the treatment ahead and what it will mean for your life. With care and support, you can manage this condition and recover from it. How to manage lifestyle changes Managing stress and anxiety  Stress is your body's reaction to life changes and events, both good and bad. Most stress will last just a few hours, but stress can be ongoing and can lead to more than just stress. Although stress can play a major role in anxiety, it is not the same as anxiety. Stress is usually caused by something external, such as a deadline, test, or competition. Stress normally passes after the triggering event has ended.  Anxiety is caused by something internal, such as imagining a terrible outcome or worrying that something will go wrong that will devastate  you. Anxiety often does not go away even after the triggering event is over, and it can become long-term (chronic) worry. It is important to understand the differences between  stress and anxiety and to manage your stress effectively so that it does not lead to an anxious response. Talk with your health care provider or a counselor to learn more about reducing anxiety and stress. He or she may suggest tension reduction techniques, such as:  Music therapy. This can include creating or listening to music that you enjoy and that inspires you.  Mindfulness-based meditation. This involves being aware of your normal breaths while not trying to control your breathing. It can be done while sitting or walking.  Centering prayer. This involves focusing on a word, phrase, or sacred image that means something to you and brings you peace.  Deep breathing. To do this, expand your stomach and inhale slowly through your nose. Hold your breath for 3-5 seconds. Then exhale slowly, letting your stomach muscles relax.  Self-talk. This involves identifying thought patterns that lead to anxiety reactions and changing those patterns.  Muscle relaxation. This involves tensing muscles and then relaxing them. Choose a tension reduction technique that suits your lifestyle and personality. These techniques take time and practice. Set aside 5-15 minutes a day to do them. Therapists can offer counseling and training in these techniques. The training to help with anxiety may be covered by some insurance plans. Other things you can do to manage stress and anxiety include:  Keeping a stress/anxiety diary. This can help you learn what triggers your reaction and then learn ways to manage your response.  Thinking about how you react to certain situations. You may not be able to control everything, but you can control your response.  Making time for activities that help you relax and not feeling guilty about spending your time in this way.  Visual imagery and yoga can help you stay calm and relax.  Medicines Medicines can help ease symptoms. Medicines for anxiety include:  Anti-anxiety  drugs.  Antidepressants. Medicines are often used as a primary treatment for anxiety disorder. Medicines will be prescribed by a health care provider. When used together, medicines, psychotherapy, and tension reduction techniques may be the most effective treatment. Relationships Relationships can play a big part in helping you recover. Try to spend more time connecting with trusted friends and family members. Consider going to couples counseling, taking family education classes, or going to family therapy. Therapy can help you and others better understand your condition. How to recognize changes in your anxiety Everyone responds differently to treatment for anxiety. Recovery from anxiety happens when symptoms decrease and stop interfering with your daily activities at home or work. This may mean that you will start to:  Have better concentration and focus. Worry will interfere less in your daily thinking.  Sleep better.  Be less irritable.  Have more energy.  Have improved memory. It is important to recognize when your condition is getting worse. Contact your health care provider if your symptoms interfere with home or work and you feel like your condition is not improving. Follow these instructions at home: Activity  Exercise. Most adults should do the following: ? Exercise for at least 150 minutes each week. The exercise should increase your heart rate and make you sweat (moderate-intensity exercise). ? Strengthening exercises at least twice a week.  Get the right amount and quality of sleep. Most adults need 7-9 hours of sleep each night. Lifestyle  Eat a healthy diet that includes plenty of vegetables, fruits, whole grains, low-fat dairy products, and lean protein. Do not eat a lot of foods that are high in solid fats, added sugars, or salt.  Make choices that simplify your life.  Do not use any products that contain nicotine or tobacco, such as cigarettes, e-cigarettes, and  chewing tobacco. If you need help quitting, ask your health care provider.  Avoid caffeine, alcohol, and certain over-the-counter cold medicines. These may make you feel worse. Ask your pharmacist which medicines to avoid. General instructions  Take over-the-counter and prescription medicines only as told by your health care provider.  Keep all follow-up visits as told by your health care provider. This is important. Where to find support You can get help and support from these sources:  Self-help groups.  Online and [employer].  A trusted spiritual leader.  Couples counseling.  Family education classes.  Family therapy. Where to find more information You may find that joining a support group helps you deal with your anxiety. The following sources can help you locate counselors or support groups near you:  Mental Health America: www.mentalhealthamerica.net  Anxiety and Depression Association of [place] (ADAA): ProgramCam.de  [employer] Mental Illness (NAMI): www.nami.org Contact a health care provider if you:  Have a hard time staying focused or finishing daily tasks.  Spend many hours a day feeling worried about everyday life.  Become exhausted by worry.  Start to have headaches, feel tense, or have nausea.  Urinate more than normal.  Have diarrhea. Get help right away if you have:  A racing heart and shortness of breath.  Thoughts of hurting yourself or others. If you ever feel like you may hurt yourself or others, or have thoughts about taking your own life, get help right away. You can go to your nearest emergency department or call:  Your local emergency services (911 in the [place]  A suicide crisis helpline, such as the National Suicide Prevention Lifeline at 603-256-9803. This is open 24 hours a day. Summary  Taking steps to learn and use tension reduction techniques can help calm you and help prevent triggering an anxiety  reaction.  When used together, medicines, psychotherapy, and tension reduction techniques may be the most effective treatment.  Family, friends, and partners can play a big part in helping you recover from an anxiety disorder. This information is not intended to replace advice given to you by your health care provider. Make sure you discuss any questions you have with your health care provider. Document Revised: 06/03/2018 Document Reviewed: 06/03/2018 Elsevier Patient Education  2020 Elsevier Inc.   Psyllium granules or powder for solution What is this medicine? PSYLLIUM (SIL i yum) is a bulk-forming fiber laxative. This medicine is used to treat constipation. Increasing fiber in the diet may also help lower cholesterol and promote heart health for some people. This medicine may be used for other purposes; ask your health care provider or pharmacist if you have questions. COMMON BRAND NAME(S): Fiber Therapy, GenFiber, Geri-Mucil, Hydrocil, Konsyl, Metamucil, Metamucil MultiHealth, Mucilin, Natural Fiber Therapy, Reguloid What should I tell my health care provider before I take this medicine? They need to know if you have any of these conditions:  blockage in your bowel  difficulty swallowing  inflammatory bowel disease  phenylketonuria  stomach or intestine problems  sudden change in bowel habits lasting more than 2 weeks  an unusual or allergic reaction to psyllium, other medicines, dyes, or preservatives  pregnant  or trying or get pregnant  breast-feeding How should I use this medicine? Mix this medicine into a full glass (240 mL) of water or other cool drink. Take this medicine by mouth. Follow the directions on the package labeling, or take as directed by your health care professional. Take your medicine at regular intervals. Do not take your medicine more often than directed. Talk to your pediatrician regarding the use of this medicine in children. While this drug may be  prescribed for children as young as 32 years old for selected conditions, precautions do apply. Overdosage: If you think you have taken too much of this medicine contact a poison control center or emergency room at once. NOTE: This medicine is only for you. Do not share this medicine with others. What if I miss a dose? If you miss a dose, take it as soon as you can. If it is almost time for your next dose, take only that dose. Do not take double or extra doses. What may interact with this medicine? Interactions are not expected. Take this product at least 2 hours before or after other medicines. This list may not describe all possible interactions. Give your health care provider a list of all the medicines, herbs, non-prescription drugs, or dietary supplements you use. Also tell them if you smoke, drink alcohol, or use illegal drugs. Some items may interact with your medicine. What should I watch for while using this medicine? Check with your doctor or health care professional if your symptoms do not start to get better or if they get worse. Stop using this medicine and contact your doctor or health care professional if you have rectal bleeding or if you have to treat your constipation for more than 1 week. These could be signs of a more serious condition. Drink several glasses of water a day while you are taking this medicine. This will help to relieve constipation and prevent dehydration. What side effects may I notice from receiving this medicine? Side effects that you should report to your doctor or health care professional as soon as possible:  allergic reactions like skin rash, itching or hives, swelling of the face, lips, or tongue  breathing problems  chest pain  nausea, vomiting  rectal bleeding  trouble swallowing Side effects that usually do not require medical attention (report to your doctor or health care professional if they continue or are  bothersome):  bloating  gas  stomach cramps This list may not describe all possible side effects. Call your doctor for medical advice about side effects. You may report side effects to FDA at 1-800-FDA-1088. Where should I keep my medicine? Keep out of the reach of children. Store at room temperature between 15 and 30 degrees C (59 and 86 degrees F). Protect from moisture. Throw away any unused medicine after the expiration date. NOTE: This sheet is a summary. It may not cover all possible information. If you have questions about this medicine, talk to your doctor, pharmacist, or health care provider.  2020 Elsevier/Gold Standard (2017-05-28 15:41:08)

## 2019-07-17 DIAGNOSIS — F419 Anxiety disorder, unspecified: Secondary | ICD-10-CM | POA: Insufficient documentation

## 2019-07-17 DIAGNOSIS — Z8719 Personal history of other diseases of the digestive system: Secondary | ICD-10-CM | POA: Insufficient documentation

## 2019-07-17 DIAGNOSIS — Z6828 Body mass index (BMI) 28.0-28.9, adult: Secondary | ICD-10-CM | POA: Insufficient documentation

## 2019-07-17 DIAGNOSIS — K644 Residual hemorrhoidal skin tags: Secondary | ICD-10-CM

## 2019-07-17 HISTORY — DX: Residual hemorrhoidal skin tags: K64.4

## 2019-07-17 HISTORY — DX: Personal history of other diseases of the digestive system: Z87.19

## 2019-07-17 NOTE — Addendum Note (Signed)
Addended by: Berniece Pap on: 07/17/2019 01:17 PM   Modules accepted: Kipp Brood

## 2019-09-20 DIAGNOSIS — Z1331 Encounter for screening for depression: Secondary | ICD-10-CM | POA: Diagnosis not present

## 2019-09-20 DIAGNOSIS — R05 Cough: Secondary | ICD-10-CM | POA: Diagnosis not present

## 2019-09-20 DIAGNOSIS — Z20822 Contact with and (suspected) exposure to covid-19: Secondary | ICD-10-CM | POA: Diagnosis not present

## 2019-10-20 ENCOUNTER — Ambulatory Visit (INDEPENDENT_AMBULATORY_CARE_PROVIDER_SITE_OTHER): Payer: BC Managed Care – PPO | Admitting: Adult Health

## 2019-10-20 DIAGNOSIS — Z5329 Procedure and treatment not carried out because of patient's decision for other reasons: Secondary | ICD-10-CM

## 2019-10-20 NOTE — Progress Notes (Signed)
No show

## 2019-10-23 DIAGNOSIS — F411 Generalized anxiety disorder: Secondary | ICD-10-CM | POA: Diagnosis not present

## 2019-10-23 DIAGNOSIS — F4010 Social phobia, unspecified: Secondary | ICD-10-CM | POA: Diagnosis not present

## 2019-11-30 DIAGNOSIS — F900 Attention-deficit hyperactivity disorder, predominantly inattentive type: Secondary | ICD-10-CM | POA: Diagnosis not present

## 2019-12-07 DIAGNOSIS — F411 Generalized anxiety disorder: Secondary | ICD-10-CM | POA: Diagnosis not present

## 2019-12-07 DIAGNOSIS — F4010 Social phobia, unspecified: Secondary | ICD-10-CM | POA: Diagnosis not present

## 2020-01-11 DIAGNOSIS — R41840 Attention and concentration deficit: Secondary | ICD-10-CM | POA: Diagnosis not present

## 2020-01-11 DIAGNOSIS — F411 Generalized anxiety disorder: Secondary | ICD-10-CM | POA: Diagnosis not present

## 2020-01-11 DIAGNOSIS — F4010 Social phobia, unspecified: Secondary | ICD-10-CM | POA: Diagnosis not present

## 2020-05-26 DIAGNOSIS — Z20822 Contact with and (suspected) exposure to covid-19: Secondary | ICD-10-CM | POA: Diagnosis not present

## 2021-04-19 DIAGNOSIS — Z0289 Encounter for other administrative examinations: Secondary | ICD-10-CM

## 2021-04-26 ENCOUNTER — Encounter (INDEPENDENT_AMBULATORY_CARE_PROVIDER_SITE_OTHER): Payer: Self-pay | Admitting: Family Medicine

## 2021-04-26 ENCOUNTER — Ambulatory Visit (INDEPENDENT_AMBULATORY_CARE_PROVIDER_SITE_OTHER): Payer: BC Managed Care – PPO | Admitting: Family Medicine

## 2021-04-26 VITALS — BP 115/79 | HR 76 | Temp 98.7°F | Ht 68.0 in | Wt 198.0 lb

## 2021-04-26 DIAGNOSIS — R0602 Shortness of breath: Secondary | ICD-10-CM | POA: Diagnosis not present

## 2021-04-26 DIAGNOSIS — Z9189 Other specified personal risk factors, not elsewhere classified: Secondary | ICD-10-CM

## 2021-04-26 DIAGNOSIS — R5383 Other fatigue: Secondary | ICD-10-CM | POA: Diagnosis not present

## 2021-04-26 DIAGNOSIS — Z6830 Body mass index (BMI) 30.0-30.9, adult: Secondary | ICD-10-CM

## 2021-04-26 DIAGNOSIS — F39 Unspecified mood [affective] disorder: Secondary | ICD-10-CM | POA: Insufficient documentation

## 2021-04-26 DIAGNOSIS — K5909 Other constipation: Secondary | ICD-10-CM

## 2021-04-26 DIAGNOSIS — Z1331 Encounter for screening for depression: Secondary | ICD-10-CM | POA: Diagnosis not present

## 2021-04-26 DIAGNOSIS — E669 Obesity, unspecified: Secondary | ICD-10-CM

## 2021-04-26 DIAGNOSIS — Z0000 Encounter for general adult medical examination without abnormal findings: Secondary | ICD-10-CM

## 2021-04-26 DIAGNOSIS — E668 Other obesity: Secondary | ICD-10-CM

## 2021-04-26 HISTORY — DX: Unspecified mood (affective) disorder: F39

## 2021-04-26 NOTE — Progress Notes (Signed)
Entered in error

## 2021-04-27 LAB — LIPID PANEL WITH LDL/HDL RATIO
Cholesterol, Total: 171 mg/dL (ref 100–199)
HDL: 49 mg/dL (ref >39)
LDL Chol Calc (NIH): 110 mg/dL — ABNORMAL HIGH (ref 0–99)
LDL/HDL Ratio: 2.2 ratio (ref 0.0–3.2)
Triglycerides: 59 mg/dL (ref 0–149)
VLDL Cholesterol Cal: 12 mg/dL (ref 5–40)

## 2021-04-27 LAB — CBC WITH DIFFERENTIAL/PLATELET
Basophils Absolute: 0 10*3/uL (ref 0.0–0.2)
Basos: 0 %
EOS (ABSOLUTE): 0.2 10*3/uL (ref 0.0–0.4)
Eos: 3 %
Hematocrit: 39.4 % (ref 34.0–46.6)
Hemoglobin: 13 g/dL (ref 11.1–15.9)
Immature Grans (Abs): 0 10*3/uL (ref 0.0–0.1)
Immature Granulocytes: 0 %
Lymphocytes Absolute: 2 10*3/uL (ref 0.7–3.1)
Lymphs: 39 %
MCH: 29.6 pg (ref 26.6–33.0)
MCHC: 33 g/dL (ref 31.5–35.7)
MCV: 90 fL (ref 79–97)
Monocytes Absolute: 0.2 10*3/uL (ref 0.1–0.9)
Monocytes: 5 %
Neutrophils Absolute: 2.7 10*3/uL (ref 1.4–7.0)
Neutrophils: 53 %
Platelets: 348 10*3/uL (ref 150–450)
RBC: 4.39 x10E6/uL (ref 3.77–5.28)
RDW: 12.5 % (ref 11.7–15.4)
WBC: 5.1 10*3/uL (ref 3.4–10.8)

## 2021-04-27 LAB — FOLATE: Folate: 18.5 ng/mL (ref >3.0)

## 2021-04-27 LAB — COMPREHENSIVE METABOLIC PANEL
ALT: 13 IU/L (ref 0–32)
AST: 14 IU/L (ref 0–40)
Albumin/Globulin Ratio: 1.4 (ref 1.2–2.2)
Albumin: 4.5 g/dL (ref 3.8–4.8)
Alkaline Phosphatase: 75 IU/L (ref 44–121)
BUN/Creatinine Ratio: 15 (ref 9–23)
BUN: 11 mg/dL (ref 6–20)
Bilirubin Total: <0.2 mg/dL (ref 0.0–1.2)
CO2: 22 mmol/L (ref 20–29)
Calcium: 9.1 mg/dL (ref 8.7–10.2)
Chloride: 101 mmol/L (ref 96–106)
Creatinine, Ser: 0.74 mg/dL (ref 0.57–1.00)
Globulin, Total: 3.2 g/dL (ref 1.5–4.5)
Glucose: 73 mg/dL (ref 70–99)
Potassium: 4.2 mmol/L (ref 3.5–5.2)
Sodium: 138 mmol/L (ref 134–144)
Total Protein: 7.7 g/dL (ref 6.0–8.5)
eGFR: 109 mL/min/{1.73_m2} (ref >59)

## 2021-04-27 LAB — INSULIN, RANDOM: INSULIN: 7 u[IU]/mL (ref 2.6–24.9)

## 2021-04-27 LAB — TSH: TSH: 1.02 u[IU]/mL (ref 0.450–4.500)

## 2021-04-27 LAB — HEMOGLOBIN A1C
Est. average glucose Bld gHb Est-mCnc: 114 mg/dL
Hgb A1c MFr Bld: 5.6 % (ref 4.8–5.6)

## 2021-04-27 LAB — T4: T4, Total: 7.1 ug/dL (ref 4.5–12.0)

## 2021-04-27 LAB — VITAMIN B12: Vitamin B-12: 504 pg/mL (ref 232–1245)

## 2021-04-27 LAB — VITAMIN D 25 HYDROXY (VIT D DEFICIENCY, FRACTURES): Vit D, 25-Hydroxy: 26 ng/mL — ABNORMAL LOW (ref 30.0–100.0)

## 2021-05-09 ENCOUNTER — Encounter (INDEPENDENT_AMBULATORY_CARE_PROVIDER_SITE_OTHER): Payer: Self-pay

## 2021-05-09 ENCOUNTER — Telehealth (INDEPENDENT_AMBULATORY_CARE_PROVIDER_SITE_OTHER): Payer: Self-pay | Admitting: Psychology

## 2021-05-09 ENCOUNTER — Encounter (INDEPENDENT_AMBULATORY_CARE_PROVIDER_SITE_OTHER): Payer: BC Managed Care – PPO | Admitting: Psychology

## 2021-05-09 NOTE — Telephone Encounter (Signed)
?  Office: [phone]  /  Fax: [phone] ? ?Date of Encounter: May 09, 2021  ?Time of Encounter: 3:04pm ?Duration of Encounter: ~5 minute(s) ?Provider: Lawerance Cruel, PsyD ? ?CONTENT: Kelly Kelly presented for today's appointment via MyChart Video Visit, but there were no video capabilities on her end. She explained her children were in the car and she was in the pickup line at their school, noting she was attempting to find a place to park. There was also an unstable connection making it difficult for her video to connect. Based on the aforementioned, this provider offered the option to reschedule. Kelly Kelly was receptive. At 3:08pm, this provider called Kelly Kelly via telephone as there were technical issues on her end. A brief risk assessment was completed. Kelly Kelly denied experiencing suicidal and homicidal ideation, plan, or intent. All questions/concerns addressed.  ? ?PLAN: Kelly Kelly is scheduled for an initial appointment on Jun 05, 2021 at 3pm via MyChart Video Visit. There will be no fee applied for this appointment being rescheduled; Kelly Kelly is aware.  ? ?

## 2021-05-09 NOTE — Progress Notes (Signed)
? ? ? ? ?Chief Complaint:  ? ?OBESITY ?Kelly Kelly (MR# [id number]) is a 36 y.o. female who presents for evaluation and treatment of obesity and related comorbidities. Current Body mass index is 30.11 kg/m?Marland Kitchen. Kelly Kelly has been struggling with her weight for many years and has been unsuccessful in either losing weight, maintaining weight loss, or reaching her healthy weight goal. ? 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 She lives with her husband and 3 sons ages 786,4, and 2. Snacks on sweets and candy, which she craves. Drinks calorie beverages, sweet tea, and fruit smoothies. Her worse habits are large portions and eating too late.  ? ?Kelly Kelly is currently in the action stage of change and ready to dedicate time achieving and maintaining a healthier weight. Kelly Kelly is interested in becoming our patient and working on intensive lifestyle modifications including (but not limited to) diet and exercise for weight loss. ? ?Kelly Kelly's habits were reviewed today and are as follows: Her family eats meals together, she thinks her family will eat healthier with her, she struggles with family and or coworkers weight loss sabotage, her desired weight loss is 23 lbs, she started gaining weight after her 3 rd pregnancy, her heaviest weight ever was 203 pounds, she has significant food cravings issues, she snacks frequently in the evenings, she is frequently drinking liquids with calories, she frequently makes poor food choices, she has problems with excessive hunger, she frequently eats larger portions than normal, and she struggles with emotional eating. ? ?Depression Screen ?Kelly Kelly's Food and Mood (modified PHQ-9) score was 15. ? ? ?  04/26/2021  ?  8:49 AM  ?Depression screen PHQ 2/9  ?Decreased Interest 3  ?Down, Depressed, Hopeless 2  ?PHQ - 2 Score 5  ?Altered sleeping 0  ?Tired, decreased energy 2  ?Change in appetite 3  ?Feeling bad or failure about yourself  2  ?Trouble concentrating 2  ?Moving slowly  or fidgety/restless 1  ?Suicidal thoughts 0  ?PHQ-9 Score 15  ?Difficult doing work/chores Not difficult at all  ? ?Subjective:  ? ?1. Other fatigue ?Kelly Kelly admits to daytime somnolence and denies waking up still tired. Patient has a history of symptoms of daytime fatigue. Kelly Kelly generally gets 8+ hours of sleep per night, and states that she has generally restful sleep. Snoring is present. Apneic episodes are not present. Epworth Sleepiness Score is 9.  ? ?2. SOB (shortness of breath) on exertion ?Kelly Kelly notes increasing shortness of breath with exercising and seems to be worsening over time with weight gain. She notes getting out of breath sooner with activity than she used to. This has not gotten worse recently. Kelly Kelly denies shortness of breath at rest or orthopnea. ? ?3. Chronic constipation ?Kelly Kelly realizes that drinking more water (1 gallon per day), and increasing fiber helps her symptoms almost go away.  ? ?4. Healthcare maintenance ?Kelly Kelly hasn't had routine CPE labs in several years.  ? ?5. Mood disorder (HCC)- emotional eating ?Kelly Kelly notes anxiety history but PHQ increased at 15 as well. She is taking Zoloft. She denies depressed mood. Only takes Zoloft as needed when she has a stressful day. Tested for ADHD 3 years ago.  ? ?6. At risk for impaired metabolic function ?Kelly Kelly is at increased risk for impaired metabolic function due to obesity and eating a lot of candy/sweets.  ? ?Assessment/Plan:  ? ?Orders Placed This Encounter  ?Procedures  ? Comprehensive metabolic panel  ? CBC with Differential/Platelet  ? Hemoglobin A1c  ? Insulin, random  ?  Lipid Panel With LDL/HDL Ratio  ? VITAMIN D 25 Hydroxy (Vit-D Deficiency, Fractures)  ? Vitamin B12  ? Folate  ? TSH  ? T4  ? EKG 12-Lead  ? ? ?There are no discontinued medications.  ? ?No orders of the defined types were placed in this encounter. ?  ? ?1. Other fatigue ?Kelly Kelly does feel that her weight is causing her energy to be lower than it  should be. Fatigue may be related to obesity, depression or many other causes. Labs will be ordered, and in the meanwhile, Kelly Kelly will focus on self care including making healthy food choices, increasing physical activity and focusing on stress reduction. ? ?- EKG 12-Lead ?- CBC with Differential/Platelet ?- Hemoglobin A1c ?- Insulin, random ?- Lipid Panel With LDL/HDL Ratio ?- VITAMIN D 25 Hydroxy (Vit-D Deficiency, Fractures) ?- Vitamin B12 ?- Folate ?- TSH ?- T4 ? ?2. SOB (shortness of breath) on exertion ?Kelly Kelly does feel that she gets out of breath more easily that she used to when she exercises. Kelly Kelly shortness of breath appears to be obesity related and exercise induced. She has agreed to work on weight loss and gradually increase exercise to treat her exercise induced shortness of breath. Will continue to monitor closely. ? ?3. Chronic constipation ?We will check labs today. Kelly Kelly's symptoms are currently under control with BMs daily.  ? ?- Lipid Panel With LDL/HDL Ratio ? ?4. Healthcare maintenance ?We will check labs today. ? ?- Lipid Panel With LDL/HDL Ratio ? ?5. Mood disorder (HCC)- emotional eating ?I recommended a referral to Dr. Dewaine Conger, our Bariatric Psychologist for emotional eating and referral placed. She is aware that she does not do depression or GAD counseling; I recommended if she feels she needs that in addition, to obtain a therapist.  ? ?- Comprehensive metabolic panel ?- Lipid Panel With LDL/HDL Ratio ? ?6. Depression screening ?Kelly Kelly had a positive depression screening. Depression is commonly associated with obesity and often results in emotional eating behaviors. We will monitor this closely and work on CBT to help improve the non-hunger eating patterns. Referral to Psychology may be required if no improvement is seen as she continues in our clinic. ? ?7. At risk for impaired metabolic function ?Kelly Kelly was given approximately 23 minutes of impaired  metabolic function  prevention counseling today. We discussed intensive lifestyle modifications today with an emphasis on specific nutrition and exercise instructions and strategies.  ? ?Repetitive spaced learning was employed today to elicit superior memory formation and behavioral change. ? ?8. Class 1 obesity with serious comorbidity and body mass index (BMI) of 30.0 to 30.9 in adult, unspecified obesity type ?Ananya is currently in the action stage of change and her goal is to continue with weight loss efforts. I recommend Assunta begin the structured treatment plan as follows: ? ?She has agreed to the Category 2 Plan with breakfast options. ? ?Exercise goals: As is.  ? ?Behavioral modification strategies: increasing lean protein intake, decreasing simple carbohydrates, keeping healthy foods in the home, and avoiding temptations. ? ?She was informed of the importance of frequent follow-up visits to maximize her success with intensive lifestyle modifications for her multiple health conditions. She was informed we would discuss her lab results at her next visit unless there is a critical issue that needs to be addressed sooner. Santanna agreed to keep her next visit at the agreed upon time to discuss these results. ? ?Objective:  ? ?Blood pressure 115/79, pulse 76, temperature 98.7 ?F (37.1 ?C), height 5\' 8"  (  1.727 m), weight 198 lb (89.8 kg), SpO2 100 %, currently breastfeeding. Body mass index is 30.11 kg/m?. ? ?EKG: Normal sinus rhythm, rate 73 BPM. ? ?Indirect Calorimeter completed today shows a VO2 of 242 and a REE of 1670.  Her calculated basal metabolic rate is 2836 thus her basal metabolic rate is worse than expected. ? ?General: Cooperative, alert, well developed, in no acute distress. ?HEENT: Conjunctivae and lids unremarkable. ?Cardiovascular: Regular rhythm.  ?Lungs: Normal work of breathing. ?Neurologic: No focal deficits.  ? ?Lab Results  ?Component Value Date  ? CREATININE 0.74 04/26/2021  ? BUN 11 04/26/2021  ? NA  138 04/26/2021  ? K 4.2 04/26/2021  ? CL 101 04/26/2021  ? CO2 22 04/26/2021  ? ?Lab Results  ?Component Value Date  ? ALT 13 04/26/2021  ? AST 14 04/26/2021  ? ALKPHOS 75 04/26/2021  ? BILITOT <0.2 04/12/

## 2021-05-10 ENCOUNTER — Ambulatory Visit (INDEPENDENT_AMBULATORY_CARE_PROVIDER_SITE_OTHER): Payer: BC Managed Care – PPO | Admitting: Family Medicine

## 2021-05-10 ENCOUNTER — Encounter (INDEPENDENT_AMBULATORY_CARE_PROVIDER_SITE_OTHER): Payer: Self-pay | Admitting: Family Medicine

## 2021-05-10 VITALS — BP 132/78 | HR 65 | Temp 98.3°F | Ht 68.0 in | Wt 191.0 lb

## 2021-05-10 DIAGNOSIS — K5909 Other constipation: Secondary | ICD-10-CM

## 2021-05-10 DIAGNOSIS — E8881 Metabolic syndrome: Secondary | ICD-10-CM | POA: Diagnosis not present

## 2021-05-10 DIAGNOSIS — E7800 Pure hypercholesterolemia, unspecified: Secondary | ICD-10-CM

## 2021-05-10 DIAGNOSIS — F411 Generalized anxiety disorder: Secondary | ICD-10-CM

## 2021-05-10 DIAGNOSIS — E669 Obesity, unspecified: Secondary | ICD-10-CM

## 2021-05-10 DIAGNOSIS — Z9189 Other specified personal risk factors, not elsewhere classified: Secondary | ICD-10-CM

## 2021-05-10 DIAGNOSIS — Z6829 Body mass index (BMI) 29.0-29.9, adult: Secondary | ICD-10-CM

## 2021-05-10 DIAGNOSIS — E559 Vitamin D deficiency, unspecified: Secondary | ICD-10-CM

## 2021-05-10 MED ORDER — VITAMIN D (ERGOCALCIFEROL) 1.25 MG (50000 UNIT) PO CAPS: 50000.0000 [IU] | ORAL_CAPSULE | ORAL | 0 refills | Status: DC

## 2021-05-11 DIAGNOSIS — E88819 Insulin resistance, unspecified: Secondary | ICD-10-CM

## 2021-05-11 DIAGNOSIS — E7800 Pure hypercholesterolemia, unspecified: Secondary | ICD-10-CM | POA: Insufficient documentation

## 2021-05-11 DIAGNOSIS — Z9189 Other specified personal risk factors, not elsewhere classified: Secondary | ICD-10-CM | POA: Insufficient documentation

## 2021-05-11 DIAGNOSIS — E559 Vitamin D deficiency, unspecified: Secondary | ICD-10-CM

## 2021-05-11 DIAGNOSIS — F411 Generalized anxiety disorder: Secondary | ICD-10-CM | POA: Insufficient documentation

## 2021-05-11 DIAGNOSIS — E8881 Metabolic syndrome: Secondary | ICD-10-CM | POA: Insufficient documentation

## 2021-05-11 HISTORY — DX: Vitamin D deficiency, unspecified: E55.9

## 2021-05-11 HISTORY — DX: Pure hypercholesterolemia, unspecified: E78.00

## 2021-05-11 HISTORY — DX: Insulin resistance, unspecified: E88.819

## 2021-05-11 HISTORY — DX: Generalized anxiety disorder: F41.1

## 2021-05-11 HISTORY — DX: Other specified personal risk factors, not elsewhere classified: Z91.89

## 2021-05-29 ENCOUNTER — Encounter (INDEPENDENT_AMBULATORY_CARE_PROVIDER_SITE_OTHER): Payer: Self-pay | Admitting: Nurse Practitioner

## 2021-05-29 ENCOUNTER — Ambulatory Visit (INDEPENDENT_AMBULATORY_CARE_PROVIDER_SITE_OTHER): Payer: BC Managed Care – PPO | Admitting: Nurse Practitioner

## 2021-05-29 VITALS — BP 110/71 | HR 68 | Temp 98.2°F | Ht 68.0 in | Wt 194.0 lb

## 2021-05-29 DIAGNOSIS — E669 Obesity, unspecified: Secondary | ICD-10-CM

## 2021-05-29 DIAGNOSIS — E559 Vitamin D deficiency, unspecified: Secondary | ICD-10-CM | POA: Diagnosis not present

## 2021-05-29 DIAGNOSIS — Z6829 Body mass index (BMI) 29.0-29.9, adult: Secondary | ICD-10-CM | POA: Diagnosis not present

## 2021-05-29 DIAGNOSIS — Z6830 Body mass index (BMI) 30.0-30.9, adult: Secondary | ICD-10-CM

## 2021-05-29 MED ORDER — VITAMIN D (ERGOCALCIFEROL) 1.25 MG (50000 UNIT) PO CAPS: 50000.0000 [IU] | ORAL_CAPSULE | ORAL | 0 refills | Status: DC

## 2021-05-29 NOTE — Progress Notes (Signed)
? ? ? ?Chief Complaint:  ? ?OBESITY ?Kelly Kelly is here to discuss her progress with her obesity treatment plan along with follow-up of her obesity related diagnoses. Kelly Kelly is on the Category 2 Plan with breakfast options and states she is following her eating plan approximately 95% of the time. Kelly Kelly states she is not currently exercising. ? ?Today's visit was #: 2 ?Starting weight: 198 lbs ?Starting date: 04/26/2021 ?Today's weight: 191 lbs ?Today's date: 05/10/2021 ?Total lbs lost to date: 7 ?Total lbs lost since last in-office visit: 7 ? ?Interim History: Kelly Kelly is here today for her first follow-up office visit since starting the program with [name].  All blood work/ lab tests that were recently ordered by myself or an outside provider were reviewed with patient today per their request.   Extended time was spent counseling her on all new disease processes that were discovered or preexisting ones that are affected by BMI.  she understands that many of these abnormalities will need to monitored regularly along with the current treatment plan of prudent dietary changes, in which we are making each and every office visit, to improve these health parameters. Kelly Kelly is here for a follow up office visit.  We reviewed her meal plan and all questions were answered.  Patient's food recall appears to be accurate and consistent with what is on plan when she is following it.  When eating on plan, her hunger and cravings are well controlled.   ? ?Subjective:  ? ?1. Insulin resistance ?New diagnosis. No history of abnormal blood sugars. Fasting insulin elevated at 7.0, A1c high normal at 5.6. I discussed labs with the patient today. ? ?2. Elevated LDL cholesterol level ?History of elevated LDL or abnormal FLP. I discussed labs with the patient today. ? ?3. GAD (generalized anxiety disorder) ?Patient denies emotional eating as an issues. Taking Zoloft. Declines need for Dr. Dewaine Conger referral.  ? ?4.  Chronic constipation ?She hasn't been drinking as much water lately, and usually when she doesn't her symptoms increase. I discussed labs with the patient today. ? ?5. Vitamin D deficiency ?New diagnosis. No prior history. Vitamin D only 26.0. Tired/low energy.  ? ?6. At risk for impaired metabolic function ?Kelly Kelly is at increased risk for impaired metabolic function due to current nutrition and muscle mass. ? ?Assessment/Plan:  ?No orders of the defined types were placed in this encounter. ? ? ?Medications Discontinued During This Encounter  ?Medication Reason  ? Prenatal Vit-Fe Fumarate-FA (PRENATAL PO) Patient Preference  ? ibuprofen (ADVIL) 600 MG tablet Patient Preference  ?  ? ?Meds ordered this encounter  ?Medications  ? DISCONTD: Vitamin D, Ergocalciferol, (DRISDOL) 1.25 MG (50000 UNIT) CAPS capsule  ?  Sig: Take 1 capsule (50,000 Units total) by mouth every 7 (seven) days.  ?  Dispense:  4 capsule  ?  Refill:  0  ?  30 d supply;  ** OV for RF **   Do not send RF request  ?  ? ?1. Insulin resistance ?Handout on insulin resistance was given to the patient after education was done on diagnosis process with the patient. Follow prudent nutritional plan, decrease simple carbs, and lose weight.  ? ?2. Elevated LDL cholesterol level ?No need for medications. Decrease sat/trans fats, and follow her prudent nutritional plan. Extensive education was done.  ? ?3. GAD (generalized anxiety disorder) ?Mood is stable. No concerns. Continue medications per PCP.  ? ?4. Chronic constipation ?Long discussion with the patient regarding increasing fiber and 1/2  her weight pin water per day or more.  ? ?5. Vitamin D deficiency ?Kelly Kelly agreed to start prescription Vitamin D 50,000 IU every week #4 with no refills. She will follow-up for routine testing of Vitamin D, at least 2-3 times per year to avoid over-replacement. ? ?6. At risk for impaired metabolic function ?Kelly Kelly was given approximately 30 minutes of impaired   metabolic function prevention counseling today. We discussed intensive lifestyle modifications today with an emphasis on specific nutrition and exercise instructions and strategies.  ? ?Repetitive spaced learning was employed today to elicit superior memory formation and behavioral change. ? ?7. Obesity, current BMI 29.1 ?Kelly Kelly is currently in the action stage of change. As such, her goal is to continue with weight loss efforts. She has agreed to the Category 2 Plan with breakfast options.  ? ?Exercise goals: As is. ? ?Behavioral modification strategies: increasing water intake, no skipping meals, better snacking choices, and planning for success. ? ?Kelly Kelly has agreed to follow-up with our clinic in 2 to 3 weeks. She was informed of the importance of frequent follow-up visits to maximize her success with intensive lifestyle modifications for her multiple health conditions.  ? ?Objective:  ? ?Blood pressure 132/78, pulse 65, temperature 98.3 ?F (36.8 ?C), height 5\' 8"  (1.727 m), weight 191 lb (86.6 kg), SpO2 99 %, currently breastfeeding. ?Body mass index is 29.04 kg/m?. ? ?General: Cooperative, alert, well developed, in no acute distress. ?HEENT: Conjunctivae and lids unremarkable. ?Cardiovascular: Regular rhythm.  ?Lungs: Normal work of breathing. ?Neurologic: No focal deficits.  ? ?Lab Results  ?Component Value Date  ? CREATININE 0.74 04/26/2021  ? BUN 11 04/26/2021  ? NA 138 04/26/2021  ? K 4.2 04/26/2021  ? CL 101 04/26/2021  ? CO2 22 04/26/2021  ? ?Lab Results  ?Component Value Date  ? ALT 13 04/26/2021  ? AST 14 04/26/2021  ? ALKPHOS 75 04/26/2021  ? BILITOT <0.2 04/26/2021  ? ?Lab Results  ?Component Value Date  ? HGBA1C 5.6 04/26/2021  ? ?Lab Results  ?Component Value Date  ? INSULIN 7.0 04/26/2021  ? ?Lab Results  ?Component Value Date  ? TSH 1.020 04/26/2021  ? ?Lab Results  ?Component Value Date  ? CHOL 171 04/26/2021  ? HDL 49 04/26/2021  ? LDLCALC 110 (H) 04/26/2021  ? TRIG 59 04/26/2021  ? ?Lab  Results  ?Component Value Date  ? VD25OH 26.0 (L) 04/26/2021  ? ?Lab Results  ?Component Value Date  ? WBC 5.1 04/26/2021  ? HGB 13.0 04/26/2021  ? HCT 39.4 04/26/2021  ? MCV 90 04/26/2021  ? PLT 348 04/26/2021  ? ?No results found for: IRON, TIBC, FERRITIN ? ?Attestation Statements:  ? ?Reviewed by clinician on day of visit: allergies, medications, problem list, medical history, surgical history, family history, social history, and previous encounter notes. ? ? ?I, 06/26/2021, am acting as transcriptionist for Burt Knack, DO. ? ?I have reviewed the above documentation for accuracy and completeness, and I agree with the above. [employer], D.O. ? ?The 21st Century Cures Act was signed into law in 2016 which includes the topic of electronic health records.  This provides immediate access to information in MyChart.  This includes consultation notes, operative notes, office notes, lab results and pathology reports.  If you have any questions about what you read please let 2017 know at your next visit so we can discuss your concerns and take corrective action if need be.  We are right here with you. ? ? ?

## 2021-05-31 NOTE — Progress Notes (Signed)
Chief Complaint:   OBESITY Kelly Kelly is here to discuss her progress with her obesity treatment plan along with follow-up of her obesity related diagnoses. Kelly Kelly is on the Category 2 Plan with breakfast options and states she is following her eating plan approximately 50% of the time. Kelly Kelly states she is walking and jumping on a trampoline for 30 minutes 1 times per week.  Today's visit was #: 3 Starting weight: 198 lbs Starting date: 04/26/2021 Today's weight: 194 lbs Today's date:05/29/2021 Total lbs lost to date: 4 lbs Total lbs lost since last in-office visit: 0  Interim History: Kelly Kelly went on a cruise since her last visit. She plans to get on track. She is drinking water with flavoring.   Subjective:   1. Vitamin D deficiency Kelly Kelly's Vitamin D was 26.0. She is taking Vitamin D 50,000 IU. She lost her bottle and needs to restart. She denies side effects nausea, vomiting, and muscle weakness.   Assessment/Plan:   1. Vitamin D deficiency Low Vitamin D level contributes to fatigue and are associated with obesity, breast, and colon cancer. We will refill prescription Vitamin D 50,000 IU every week for 1 month with 1 refill and Kelly Kelly will follow-up for routine testing of Vitamin D, at least 2-3 times per year to avoid over-replacement. We discussed side effects today.   - Vitamin D, Ergocalciferol, (DRISDOL) 1.25 MG (50000 UNIT) CAPS capsule; Take 1 capsule (50,000 Units total) by mouth every 7 (seven) days.  Dispense: 4 capsule; Refill: 0  2. Obesity, current BMI 29.6 Kelly Kelly is currently in the action stage of change. As such, her goal is to continue with weight loss efforts. She has agreed to the Category 2 Plan.   Exercise goals:  As is.   Behavioral modification strategies: increasing lean protein intake, increasing water intake, and no skipping meals.  Kelly Kelly has agreed to follow-up with our clinic in 3 weeks. She was informed of the importance of  frequent follow-up visits to maximize her success with intensive lifestyle modifications for her multiple health conditions.   Objective:   Blood pressure 110/71, pulse 68, temperature 98.2 F (36.8 C), height 5\' 8"  (1.727 m), weight 194 lb (88 kg), SpO2 99 %, currently breastfeeding. Body mass index is 29.5 kg/m.  General: Cooperative, alert, well developed, in no acute distress. HEENT: Conjunctivae and lids unremarkable. Cardiovascular: Regular rhythm.  Lungs: Normal work of breathing. Neurologic: No focal deficits.   Lab Results  Component Value Date   CREATININE 0.74 04/26/2021   BUN 11 04/26/2021   NA 138 04/26/2021   K 4.2 04/26/2021   CL 101 04/26/2021   CO2 22 04/26/2021   Lab Results  Component Value Date   ALT 13 04/26/2021   AST 14 04/26/2021   ALKPHOS 75 04/26/2021   BILITOT <0.2 04/26/2021   Lab Results  Component Value Date   HGBA1C 5.6 04/26/2021   Lab Results  Component Value Date   INSULIN 7.0 04/26/2021   Lab Results  Component Value Date   TSH 1.020 04/26/2021   Lab Results  Component Value Date   CHOL 171 04/26/2021   HDL 49 04/26/2021   LDLCALC 110 (H) 04/26/2021   TRIG 59 04/26/2021   Lab Results  Component Value Date   VD25OH 26.0 (L) 04/26/2021   Lab Results  Component Value Date   WBC 5.1 04/26/2021   HGB 13.0 04/26/2021   HCT 39.4 04/26/2021   MCV 90 04/26/2021   PLT 348 04/26/2021  No results found for: IRON, TIBC, FERRITIN  Attestation Statements:   Reviewed by clinician on day of visit: allergies, medications, problem list, medical history, surgical history, family history, social history, and previous encounter notes.   I, Lizbeth Bark, RMA, am acting as Location manager for Everardo Pacific, FNP.  I have reviewed the above documentation for accuracy and completeness, and I agree with the above. Everardo Pacific, FNP

## 2021-06-05 ENCOUNTER — Telehealth (INDEPENDENT_AMBULATORY_CARE_PROVIDER_SITE_OTHER): Payer: BC Managed Care – PPO | Admitting: Psychology

## 2021-06-13 NOTE — Progress Notes (Signed)
Office: [phone]  /  Fax: [phone]    Date: 06/26/2021   Appointment Start Time: 12:04pm Duration: 32 minutes Provider: Lawerance Cruel, Psy.D. Type of Session: Intake for Individual Therapy  Location of Patient: Parked in car outside of library in [place] (safe/private location) Location of Provider: Provider's home (private office) Type of Contact: Telepsychological Visit via MyChart Video Visit  Informed Consent: This provider called Francies at 12:02pm as she did not present for today's appointment. She acknowledged she "forgot about it," adding she could join. As such, today's appointment was initiated 4 minutes late. Prior to proceeding with today's appointment, two pieces of identifying information were obtained. In addition, Deztinee's physical location at the time of this appointment was obtained as well a phone number she could be reached at in the event of technical difficulties. [name] and this provider participated in today's telepsychological service.   The provider's role was explained to [facility]. The provider reviewed and discussed issues of confidentiality, privacy, and limits therein (e.g., reporting obligations). In addition to verbal informed consent, written informed consent for psychological services was obtained prior to the initial appointment. Since the clinic is not a 24/7 crisis center, mental health emergency resources were shared and this  provider explained MyChart, e-mail, voicemail, and/or other messaging systems should be utilized only for non-emergency reasons. This provider also explained that information obtained during appointments will be placed in [facility] medical record and relevant information will be shared with other providers at [facility] for coordination of care. Temia agreed information may be shared with other [facility] providers as needed for coordination of care and by signing the service agreement  document, she provided written consent for coordination of care. Prior to initiating telepsychological services, Sanyla completed an informed consent document, which included the development of a safety plan (i.e., an emergency contact and emergency resources) in the event of an emergency/crisis. Darien verbally acknowledged understanding she is ultimately responsible for understanding her insurance benefits for telepsychological and in-person services. This provider also reviewed confidentiality, as it relates to telepsychological services, as well as the rationale for telepsychological services (i.e., to reduce exposure risk to COVID-19). Roza  acknowledged understanding that appointments cannot be recorded without both party consent and she is aware she is responsible for securing confidentiality on her end of the session. Rosario verbally consented to proceed.  Per [facility]'s new policy, Annalese will be e-mailed two additional forms (AOB and Receipt of NPP) to sign as well as [facility]'s Notice of Privacy Practices. The content of the documents were explained to [facility] at the onset of the appointment, and she agreed to proceed. Additionally, she agreed to complete the forms and return them.  Of note, today's appointment was switched to a regular telephone call at 12:12pm with Rees's verbal consent due to technical issues on Shenita's end.   Chief Complaint/HPI: Letha was referred by Dr. Thomasene Lot due to  "Mood disorder [facility])- emotional eating" . Per the note for the initial visit with Dr. Thomasene Lot on 04/26/2021, "[name] notes anxiety history but PHQ increased at 15 as well. She is taking Zoloft. She denies depressed mood. Only takes Zoloft as needed when she has a stressful day. Tested for ADHD 3 years ago" The note for the initial appointment further indicated the following: "Zayana's habits were reviewed today and are as follows: Her family eats meals together, she  thinks her family will eat healthier with her, she struggles with family and or coworkers weight  loss sabotage, her desired weight loss is 23 lbs, she started gaining weight after her 3 rd pregnancy, her heaviest weight ever was 203 pounds, she has significant food cravings issues, she snacks frequently in the evenings, she is frequently drinking liquids with calories, she frequently makes poor food choices, she has problems with excessive hunger, she frequently eats larger portions than normal, and she struggles with emotional eating." Dyani's Food and Mood (modified PHQ-9) score on 04/26/2021 was 15.  During today's appointment, Kathaleen reported she feels she is "doing better" with not engaging in emotional eating behaviors when feeling anxious or stressed. She also reported a reduction in portion sizes. She explained she often would reach for something sweet when feeling anxious or stressed. Jaina believes the onset of emotional eating behaviors was likely in childhood, and described the current frequency of emotional eating behaviors as "maybe once a day." In addition, Eather denied a history of binge eating behaviors. Everline denied a history of significantly restricting food intake, purging and engagement in other compensatory strategies, and has never been diagnosed with an eating disorder. She also denied a history of treatment for emotional eating behaviors.   Mental Status Examination:  Appearance: neat Behavior: appropriate to circumstances Mood: neutral Affect: mood congruent Speech: WNL Eye Contact: appropriate Psychomotor Activity: WNL Gait: unable to assess  Thought Process: linear, logical, and goal directed and denies suicidal, homicidal, and self-harm ideation, plan and intent  Thought Content/Perception: no hallucinations, delusions, bizarre thinking or behavior endorsed or observed Orientation: AAOx4 Memory/Concentration: intact and memory, attention, language, and fund  of knowledge intact ; however, pt reported attention, concentration, and memory related concerns Insight/Judgment: fair  Family & Psychosocial History: Adelise reported she is married and she has three children (ages 27, 37, and 6). 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 Additionally, Brenlynn shared her highest level of education obtained is a master's degree. Currently, Charelle's social support system consists of her husband, mom, dad, mother in law, grandfather, and two sisters. Moreover, Tonantzin stated she resides with her husband and children.   Medical History:  Past Medical History:  Diagnosis Date   Anxiety    Constipation    Hyperhidrosis    Medical history non-contributory    Past Surgical History:  Procedure Laterality Date   NO PAST SURGERIES     Current Outpatient Medications on File Prior to Visit  Medication Sig Dispense Refill   loratadine (CLARITIN) 5 MG/5ML syrup Take 5 mg by mouth daily.     sertraline (ZOLOFT) 100 MG tablet Take 100 mg by mouth daily.     Vitamin D, Ergocalciferol, (DRISDOL) 1.25 MG (50000 UNIT) CAPS capsule Take 1 capsule (50,000 Units total) by mouth every 7 (seven) days. 4 capsule 0   No current facility-administered medications on file prior to visit.  Medication compliant.   Mental Health History: Raylin reported she attended therapeutic services when she was pregnant with her last child to address stress and anxiety. She reported an unknown provider prescribed Zoloft to address anxiety, adding she did not go back to the provider. She recalled she was referred to this provider by her PCP. Dena reported there is no history of hospitalizations for psychiatric concerns. Arien reported a family history of anxiety (father). Hasna reported there is no history of trauma including psychological, physical , and sexual abuse, as well as neglect.   Gennette described her typical mood lately as "pretty  calm, relaxed." Aside from concerns noted above and  endorsed on the PHQ-9 and GAD-7, Makylee reported experiencing social withdrawal, attention, concentration, and memory related concerns. She wonders if she meets criteria for ADHD. She reported challenges with employment related tasks as her mind wanders and she copes by writing things down. Tameisha denied current alcohol use. She denied tobacco use. She denied illicit/recreational substance use. Furthermore, [name] indicated she is not experiencing the following: hallucinations and delusions, paranoia, symptoms of mania , crying spells, panic attacks, and obsessions and compulsions. She also denied history of and current suicidal ideation, plan, and intent; history of and current homicidal ideation, plan, and intent; and history of and current engagement in self-harm.  The following strengths were reported by [place]: creative, funny, and caring. The following strengths were observed by this provider: ability to express thoughts and feelings during the therapeutic session, ability to establish and benefit from a therapeutic relationship, willingness to work toward established goal(s) with the clinic and ability to engage in reciprocal conversation.   Legal History: Oreatha reported there is no history of legal involvement.   Structured Assessments Results: The Patient Health Questionnaire-9 (PHQ-9) is a self-report measure that assesses symptoms and severity of depression over the course of the last two weeks. Beadie obtained a score of 4 suggesting minimal depression. Amellia finds the endorsed symptoms to be somewhat difficult. [0= Not at all; 1= Several days; 2= More than half the days; 3= Nearly every day] Little interest or pleasure in doing things 0  Feeling down, depressed, or hopeless 0  Trouble falling or staying asleep, or sleeping too much 1  Feeling tired or having little energy 0  Poor appetite or overeating 0  Feeling bad about  yourself --- or that you are a failure or have let yourself or your family down 0  Trouble concentrating on things, such as reading the newspaper or watching television 3  Moving or speaking so slowly that other people could have noticed? Or the opposite --- being so fidgety or restless that you have been moving around a lot more than usual 0  Thoughts that you would be better off dead or hurting yourself in some way 0  PHQ-9 Score 4    The Generalized Anxiety Disorder-7 (GAD-7) is a brief self-report measure that assesses symptoms of anxiety over the course of the last two weeks. [place] obtained a score of 3 suggesting minimal anxiety. Shakiara finds the endorsed symptoms to be somewhat difficult. [0= Not at all; 1= Several days; 2= Over half the days; 3= Nearly every day] Feeling nervous, anxious, on edge 0  Not being able to stop or control worrying 0  Worrying too much about different things (e.g., doing something new) 1  Trouble relaxing 1  Being so restless that it's hard to sit still 0  Becoming easily annoyed or irritable 1  Feeling afraid as if something awful might happen 0  GAD-7 Score 3   Interventions:  Conducted a chart review Focused on rapport building Verbally administered PHQ-9 and GAD-7 for symptom monitoring Provided emphatic reflections and validation Psychoeducation provided regarding physical versus emotional hunger Recommended/discussed option for longer-term therapeutic and psychiatric services  Diagnostic Impressions & Provisional DSM-5 Diagnosis(es): Katilynn endorsed a history of engagement in emotional eating behaviors secondary to anxiety and stress starting in childhood, noting the current frequency as daily. She denied engagement in any other disordered eating behaviors. Based on the aforementioned, the following diagnosis was assigned: F50.89 Other Specified Feeding or Eating Disorder, Emotional Eating Behaviors. Azani would benefit from further  evaluation of anxiety, attention, concentration, and memory related concerns.   Plan: Rica KoyanagiLashawna appears able and willing to participate as evidenced by engagement in reciprocal conversation and asking questions as needed for clarification. Based on endorsed emotional eating behaviors, continuing with this provider was recommended. However, at this time, she declined future appointments with this provider as she expressed desire to address anxiety, attention, concentration, and memory related concerns. She provided verbal consent for this provider to e-mail referral options of therapeutic and psychiatric services. She acknowledged understanding that she may request a follow-up appointment with this provider in the future as long as she is still established with the clinic. She will be sent a handout via e-mail to increase awareness of hunger patterns and subsequent eating. Daelyn provided verbal consent during today's appointment for this provider to send the handout via e-mail. Additionally, Rica KoyanagiLashawna will complete and return forms. No further follow-up planned by this provider.

## 2021-06-20 ENCOUNTER — Ambulatory Visit (INDEPENDENT_AMBULATORY_CARE_PROVIDER_SITE_OTHER): Payer: BC Managed Care – PPO | Admitting: Family Medicine

## 2021-06-20 ENCOUNTER — Encounter (INDEPENDENT_AMBULATORY_CARE_PROVIDER_SITE_OTHER): Payer: Self-pay | Admitting: Family Medicine

## 2021-06-20 VITALS — BP 101/64 | HR 69 | Temp 98.4°F | Ht 68.0 in | Wt 184.0 lb

## 2021-06-20 DIAGNOSIS — E559 Vitamin D deficiency, unspecified: Secondary | ICD-10-CM | POA: Diagnosis not present

## 2021-06-20 DIAGNOSIS — E8881 Metabolic syndrome: Secondary | ICD-10-CM

## 2021-06-20 DIAGNOSIS — F3289 Other specified depressive episodes: Secondary | ICD-10-CM | POA: Diagnosis not present

## 2021-06-20 DIAGNOSIS — Z6828 Body mass index (BMI) 28.0-28.9, adult: Secondary | ICD-10-CM

## 2021-06-20 DIAGNOSIS — E669 Obesity, unspecified: Secondary | ICD-10-CM | POA: Diagnosis not present

## 2021-06-20 MED ORDER — VITAMIN D (ERGOCALCIFEROL) 1.25 MG (50000 UNIT) PO CAPS: 50000.0000 [IU] | ORAL_CAPSULE | ORAL | 0 refills | Status: DC

## 2021-06-26 ENCOUNTER — Telehealth (INDEPENDENT_AMBULATORY_CARE_PROVIDER_SITE_OTHER): Payer: BC Managed Care – PPO | Admitting: Psychology

## 2021-06-26 DIAGNOSIS — F5089 Other specified eating disorder: Secondary | ICD-10-CM | POA: Diagnosis not present

## 2021-06-28 DIAGNOSIS — F32A Depression, unspecified: Secondary | ICD-10-CM | POA: Insufficient documentation

## 2021-06-28 NOTE — Progress Notes (Signed)
Chief Complaint:   OBESITY Kelly Kelly is here to discuss her progress with her obesity treatment plan along with follow-up of her obesity related diagnoses. Kelly Kelly is on the Category 2 Plan and states she is following her eating plan approximately 90% of the time. Kelly Kelly states she is walking 30 minutes 2 times per week.  Today's visit was #: 4  Starting weight: 198 lbs Starting date: 04/26/2021 Today's weight: 184 lbs Today's date: 06/20/2021 Total lbs lost to date: 14 lbs Total lbs lost since last in-office visit: 10 lbs  Interim History: Since Kelly Kelly's last office visit with Kelly Kelly on 05/29/21, she has got back on plan, she has been measuring her proteins and eating all foods.  She denies hunger, but craves the foods that she feeds the kids sometimes.  Snacks:  popcorn, pretzels, fruit.  She measures these things.  Subjective:   1. Insulin resistance Fasting insulin was 7.0, A1c was 5.6;  1-1/2 months ago.  Kelly Kelly's is unsure if she has carb cravings or not.  If she is not around it she does not crave it.   2. Vitamin D deficiency She is currently taking prescription vitamin D 50,000 IU each week. She denies nausea, vomiting or muscle weakness.  1-1/2 months ago Vitamin D was 26.0.  3. Other depression, with emotional eating We discussed her cravings which usually happens first thing in the morning while feeding her children muffins.  She has always picked and has eaten whatever they ate.   Assessment/Plan:  No orders of the defined types were placed in this encounter.   Medications Discontinued During This Encounter  Medication Reason   Vitamin D, Ergocalciferol, (DRISDOL) 1.25 MG (50000 UNIT) CAPS capsule Reorder     Meds ordered this encounter  Medications   Vitamin D, Ergocalciferol, (DRISDOL) 1.25 MG (50000 UNIT) CAPS capsule    Sig: Take 1 capsule (50,000 Units total) by mouth every 7 (seven) days.    Dispense:  4 capsule    Refill:  0    30 d  supply;  ** OV for RF **   Do not send RF request     1. Insulin resistance Risks and benefits discussed about metformin. Handouts given on insulin resistance and metformin.  Kelly Kelly will let Kelly Kelly know at next office visit if she is interested in starting.    2. Vitamin D deficiency Low Vitamin D level contributes to fatigue and are associated with obesity, breast, and colon cancer. She agrees to continue to take prescription Vitamin D @50 ,000 IU every week and will follow-up for routine testing of Vitamin D, at least 2-3 times per year to avoid over-replacement. Refill Ergocalciferol and continue weekly, she is currently not at goal. See below.  - Vitamin D, Ergocalciferol, (DRISDOL) 1.25 MG (50000 UNIT) CAPS capsule; Take 1 capsule (50,000 Units total) by mouth every 7 (seven) days.  Dispense: 4 capsule; Refill: 0  3. Other depression, with emotional eating Discussed emotional eating strategies,try to create new habits to avoid exposures to tempting foods and to eat before her children eats with Kelly Kelly.  4. Obesity, Current BMI 28.1 Try tuna as a snack.  Kelly Kelly's goal is to walk 10,000 steps daily at least 5 days per week.    [name] is currently in the action stage of change. As such, her goal is to continue with weight loss efforts. She has agreed to the Category 2 Plan.   Exercise goals: Kelly Kelly's goal is to walk 10,000 steps daily at least  5 days per week.  Behavioral modification strategies: meal planning and cooking strategies, emotional eating strategies, and avoiding temptations.  Kelly Kelly has agreed to follow-up with our clinic in 3 weeks. She was informed of the importance of frequent follow-up visits to maximize her success with intensive lifestyle modifications for her multiple health conditions.   Objective:   Blood pressure 101/64, pulse 69, temperature 98.4 F (36.9 C), height 5\' 8"  (1.727 m), weight 184 lb (83.5 kg), last menstrual period 05/29/2021, SpO2 99 %,  currently breastfeeding. Body mass index is 27.98 kg/m.  General: Cooperative, alert, well developed, in no acute distress. HEENT: Conjunctivae and lids unremarkable. Cardiovascular: Regular rhythm.  Lungs: Normal work of breathing. Neurologic: No focal deficits.   Lab Results  Component Value Date   CREATININE 0.74 04/26/2021   BUN 11 04/26/2021   NA 138 04/26/2021   K 4.2 04/26/2021   CL 101 04/26/2021   CO2 22 04/26/2021   Lab Results  Component Value Date   ALT 13 04/26/2021   AST 14 04/26/2021   ALKPHOS 75 04/26/2021   BILITOT <0.2 04/26/2021   Lab Results  Component Value Date   HGBA1C 5.6 04/26/2021   Lab Results  Component Value Date   INSULIN 7.0 04/26/2021   Lab Results  Component Value Date   TSH 1.020 04/26/2021   Lab Results  Component Value Date   CHOL 171 04/26/2021   HDL 49 04/26/2021   LDLCALC 110 (H) 04/26/2021   TRIG 59 04/26/2021   Lab Results  Component Value Date   VD25OH 26.0 (L) 04/26/2021   Lab Results  Component Value Date   WBC 5.1 04/26/2021   HGB 13.0 04/26/2021   HCT 39.4 04/26/2021   MCV 90 04/26/2021   PLT 348 04/26/2021   No results found for: "IRON", "TIBC", "FERRITIN"  Attestation Statements:   Reviewed by clinician on day of visit: allergies, medications, problem list, medical history, surgical history, family history, social history, and previous encounter notes.  Time spent on visit including pre-visit chart review and post-visit care and charting was 30 minutes.   I, 06/26/2021, RMA, am acting as [place] for Energy manager, DO.  I have reviewed the above documentation for accuracy and completeness, and I agree with the above. [employer], D.O.  The 21st Century Cures Act was signed into law in 2016 which includes the topic of electronic health records.  This provides immediate access to information in MyChart.  This includes consultation notes, operative notes, office notes, lab results  and pathology reports.  If you have any questions about what you read please let 2017 know at your next visit so we can discuss your concerns and take corrective action if need be.  We are right here with you.

## 2021-07-12 ENCOUNTER — Ambulatory Visit (INDEPENDENT_AMBULATORY_CARE_PROVIDER_SITE_OTHER): Payer: BC Managed Care – PPO | Admitting: Family Medicine

## 2021-07-25 ENCOUNTER — Ambulatory Visit (INDEPENDENT_AMBULATORY_CARE_PROVIDER_SITE_OTHER): Payer: 59 | Admitting: Nurse Practitioner

## 2021-07-25 ENCOUNTER — Encounter (INDEPENDENT_AMBULATORY_CARE_PROVIDER_SITE_OTHER): Payer: Self-pay | Admitting: Nurse Practitioner

## 2021-07-25 VITALS — BP 111/74 | HR 72 | Temp 98.3°F | Ht 68.0 in | Wt 178.0 lb

## 2021-07-25 DIAGNOSIS — E669 Obesity, unspecified: Secondary | ICD-10-CM | POA: Diagnosis not present

## 2021-07-25 DIAGNOSIS — Z6827 Body mass index (BMI) 27.0-27.9, adult: Secondary | ICD-10-CM

## 2021-07-25 DIAGNOSIS — E559 Vitamin D deficiency, unspecified: Secondary | ICD-10-CM

## 2021-07-25 MED ORDER — VITAMIN D (ERGOCALCIFEROL) 1.25 MG (50000 UNIT) PO CAPS: 50000.0000 [IU] | ORAL_CAPSULE | ORAL | 0 refills | Status: DC

## 2021-07-26 NOTE — Progress Notes (Unsigned)
Chief Complaint:   OBESITY Kelly Kelly is here to discuss her progress with her obesity treatment plan along with follow-up of her obesity related diagnoses. Kelly Kelly is on the Category 2 Plan and states she is following her eating plan approximately 70% of the time. Kelly Kelly states she is walking and going to the gym 30 minutes 3 times per week.  Today's visit was #: 5 Starting weight: 198 lbs Starting date: 04/26/2021 Today's weight: 178 lbs Today's date: 07/25/2021 Total lbs lost to date: 20 lbs Total lbs lost since last in-office visit: 6  Interim History: Kelly Kelly has done well with weight loss since last visit. She is struggling with cravings and hunger prior and during menses.  Has not been measuring protein on the regular basis. She is drinking water with flavoring, denies sugar drinks. She has been exercising and moving more.  Subjective:   1. Vitamin D deficiency Kelly Kelly stopped taking VitD 3 weeks ago. She was not aware that her Vit D was refilled. Her last Vit D level of 26.  Assessment/Plan:   1. Vitamin D deficiency We will refill Vit D 50,000 IU once a week for 1 month with 0 refills.  Low Vitamin D level contributes to fatigue and are associated with obesity, breast, and colon cancer. She agrees to continue to take prescription Vitamin D @50 ,000 IU every week and will follow-up for routine testing of Vitamin D, at least 2-3 times per year to avoid over-replacement.   -Refill Vitamin D, Ergocalciferol, (DRISDOL) 1.25 MG (50000 UNIT) CAPS capsule; Take 1 capsule (50,000 Units total) by mouth every 7 (seven) days.  Dispense: 4 capsule; Refill: 0  2. Obesity, Current BMI 27.1 Kelly Kelly is currently in the action stage of change. As such, her goal is to continue with weight loss efforts. She has agreed to the Category 2 Plan.   Exercise goals: As is.  Behavioral modification strategies: increasing lean protein intake, increasing water intake, and planning for  success.  Kelly Kelly has agreed to follow-up with our clinic in 3 weeks. She was informed of the importance of frequent follow-up visits to maximize her success with intensive lifestyle modifications for her multiple health conditions.   Objective:   Blood pressure 111/74, pulse 72, temperature 98.3 F (36.8 C), height 5\' 8"  (1.727 m), weight 178 lb (80.7 kg), SpO2 100 %, currently breastfeeding. Body mass index is 27.06 kg/m.  General: Cooperative, alert, well developed, in no acute distress. HEENT: Conjunctivae and lids unremarkable. Cardiovascular: Regular rhythm.  Lungs: Normal work of breathing. Neurologic: No focal deficits.   Lab Results  Component Value Date   CREATININE 0.74 04/26/2021   BUN 11 04/26/2021   NA 138 04/26/2021   K 4.2 04/26/2021   CL 101 04/26/2021   CO2 22 04/26/2021   Lab Results  Component Value Date   ALT 13 04/26/2021   AST 14 04/26/2021   ALKPHOS 75 04/26/2021   BILITOT <0.2 04/26/2021   Lab Results  Component Value Date   HGBA1C 5.6 04/26/2021   Lab Results  Component Value Date   INSULIN 7.0 04/26/2021   Lab Results  Component Value Date   TSH 1.020 04/26/2021   Lab Results  Component Value Date   CHOL 171 04/26/2021   HDL 49 04/26/2021   LDLCALC 110 (H) 04/26/2021   TRIG 59 04/26/2021   Lab Results  Component Value Date   VD25OH 26.0 (L) 04/26/2021   Lab Results  Component Value Date   WBC 5.1 04/26/2021  HGB 13.0 04/26/2021   HCT 39.4 04/26/2021   MCV 90 04/26/2021   PLT 348 04/26/2021   No results found for: "IRON", "TIBC", "FERRITIN"  Attestation Statements:   Reviewed by clinician on day of visit: allergies, medications, problem list, medical history, surgical history, family history, social history, and previous encounter notes.  I, Brendell Tyus, RMA, am acting as transcriptionist for Irene Limbo, FNP.  I have reviewed the above documentation for accuracy and completeness, and I agree with the  above. Irene Limbo, FNP

## 2021-08-16 ENCOUNTER — Encounter (INDEPENDENT_AMBULATORY_CARE_PROVIDER_SITE_OTHER): Payer: Self-pay | Admitting: Nurse Practitioner

## 2021-08-16 ENCOUNTER — Ambulatory Visit (INDEPENDENT_AMBULATORY_CARE_PROVIDER_SITE_OTHER): Payer: 59 | Admitting: Nurse Practitioner

## 2021-08-16 VITALS — BP 98/64 | HR 85 | Temp 98.4°F | Ht 68.0 in | Wt 178.0 lb

## 2021-08-16 DIAGNOSIS — E669 Obesity, unspecified: Secondary | ICD-10-CM | POA: Diagnosis not present

## 2021-08-16 DIAGNOSIS — E559 Vitamin D deficiency, unspecified: Secondary | ICD-10-CM

## 2021-08-16 DIAGNOSIS — Z6827 Body mass index (BMI) 27.0-27.9, adult: Secondary | ICD-10-CM | POA: Diagnosis not present

## 2021-08-16 MED ORDER — VITAMIN D (ERGOCALCIFEROL) 1.25 MG (50000 UNIT) PO CAPS: 50000.0000 [IU] | ORAL_CAPSULE | ORAL | 0 refills | Status: DC

## 2021-08-17 NOTE — Progress Notes (Signed)
Chief Complaint:   OBESITY Kelly Kelly is here to discuss her progress with her obesity treatment plan along with follow-up of her obesity related diagnoses. Jaretssi is on the Category 2 Plan and states she is following her eating plan approximately 90% of the time. Dawson states she is doing You-tube cardio 30 minutes 4-5 times per week.  Today's visit was #: 6 Starting weight: 198 lbs Starting date: 04/26/2021 Today's weight: 178 lbs Today's date: 08/16/2021 Total lbs lost to date: 20 lbs Total lbs lost since last in-office visit: 0  Interim History: Kelly Kelly is following intermitted fasting, 10 am-8 pm. Eating 3 meals and 2 snacks. Weighing protein. Snacks are: yogurt, [employer] chips and popcorn. Drinking water daily and sparkling water. Denies sugary drinks. She is struggling with hunger and cravings.  Subjective:   1. Vitamin D deficiency Kelly Kelly took Vit D last in June. She is not taking an over the counter Vit d. Her last Vit D level of 26.0.  Assessment/Plan:   1. Vitamin D deficiency Labs discussed during visit today. We will refill Vit D 50,000 IU once a week for 1 month with 0 refills.  -Refill Vitamin D, Ergocalciferol, (DRISDOL) 1.25 MG (50000 UNIT) CAPS capsule; Take 1 capsule (50,000 Units total) by mouth every 7 (seven) days.  Dispense: 4 capsule; Refill: 0  2. Obesity, Current BMI 27.2 Kelly Kelly is currently in the action stage of change. As such, her goal is to continue with weight loss efforts. She has agreed to the Category 2 Plan.   Exercise goals: As is.  Multiple handouts given today.  Behavioral modification strategies: increasing lean protein intake, increasing vegetables, and increasing water intake.  Kelly Kelly has agreed to follow-up with our clinic in 3 weeks. She was informed of the importance of frequent follow-up visits to maximize her success with intensive lifestyle modifications for her multiple health conditions.   Objective:   Blood  pressure 98/64, pulse 85, temperature 98.4 F (36.9 C), height 5\' 8"  (1.727 m), weight 178 lb (80.7 kg), SpO2 100 %, currently breastfeeding. Body mass index is 27.06 kg/m.  General: Cooperative, alert, well developed, in no acute distress. HEENT: Conjunctivae and lids unremarkable. Cardiovascular: Regular rhythm.  Lungs: Normal work of breathing. Neurologic: No focal deficits.   Lab Results  Component Value Date   CREATININE 0.74 04/26/2021   BUN 11 04/26/2021   NA 138 04/26/2021   K 4.2 04/26/2021   CL 101 04/26/2021   CO2 22 04/26/2021   Lab Results  Component Value Date   ALT 13 04/26/2021   AST 14 04/26/2021   ALKPHOS 75 04/26/2021   BILITOT <0.2 04/26/2021   Lab Results  Component Value Date   HGBA1C 5.6 04/26/2021   Lab Results  Component Value Date   INSULIN 7.0 04/26/2021   Lab Results  Component Value Date   TSH 1.020 04/26/2021   Lab Results  Component Value Date   CHOL 171 04/26/2021   HDL 49 04/26/2021   LDLCALC 110 (H) 04/26/2021   TRIG 59 04/26/2021   Lab Results  Component Value Date   VD25OH 26.0 (L) 04/26/2021   Lab Results  Component Value Date   WBC 5.1 04/26/2021   HGB 13.0 04/26/2021   HCT 39.4 04/26/2021   MCV 90 04/26/2021   PLT 348 04/26/2021   No results found for: "IRON", "TIBC", "FERRITIN"  Attestation Statements:   Reviewed by clinician on day of visit: allergies, medications, problem list, medical history, surgical history, family history,  social history, and previous encounter notes.  I, Brendell Tyus, RMA, am acting as transcriptionist for Irene Limbo, FNP.  I have reviewed the above documentation for accuracy and completeness, and I agree with the above. Irene Limbo, FNP

## 2021-08-23 ENCOUNTER — Encounter (INDEPENDENT_AMBULATORY_CARE_PROVIDER_SITE_OTHER): Payer: Self-pay

## 2021-09-06 ENCOUNTER — Encounter: Payer: Self-pay | Admitting: Family Medicine

## 2021-09-06 ENCOUNTER — Ambulatory Visit (INDEPENDENT_AMBULATORY_CARE_PROVIDER_SITE_OTHER): Payer: 59 | Admitting: Family Medicine

## 2021-09-06 DIAGNOSIS — F419 Anxiety disorder, unspecified: Secondary | ICD-10-CM

## 2021-09-06 DIAGNOSIS — K5909 Other constipation: Secondary | ICD-10-CM

## 2021-09-06 DIAGNOSIS — E669 Obesity, unspecified: Secondary | ICD-10-CM | POA: Diagnosis not present

## 2021-09-06 DIAGNOSIS — R69 Illness, unspecified: Secondary | ICD-10-CM | POA: Diagnosis not present

## 2021-09-06 MED ORDER — SERTRALINE HCL 50 MG PO TABS: 50.0000 mg | ORAL_TABLET | Freq: Every day | ORAL | 0 refills | Status: DC

## 2021-09-06 MED ORDER — POLYETHYLENE GLYCOL 3350 17 GM/SCOOP PO POWD: 17.0000 g | Freq: Every day | ORAL | 0 refills | Status: DC

## 2021-09-06 NOTE — Progress Notes (Signed)
    SUBJECTIVE:   CHIEF COMPLAINT / HPI: establish care, refills for anxiety  Patient presents to clinic to establish care  Anxiety Reports history of anxiety.  Had run out of medications and was taking her fathers Zoloft.  Recently ran out about 1 month ago.  Would like to restart.  Has been followed by [facility] with Zorita Pang, PA for counseling. Mood has been ok but she reports feeling better on medication.  Denies any SI/HI.  Constipation Reports history of constipation for years.  Bowel movements have been every 4-5 days, normally every day or every other day.  Has been working with [facility] to help with weight reduction and diet changes.  Water and soluble fiber intake low.   Elevated BMI Has been actively working on [employer].  Sees Dr Sharee Holster [facility] [place].  PERTINENT  PMH / PSH:  Obesity class 1 Anxiety Constipation  Dad, prostate cancer Mom, HTN Maternal GM, cancer Paternal uncle, cancer  No Tobacco use No THC/Cocaine/Heroine use No EtOH use  Vasectomy for birth control LMP second week in Aug  OBJECTIVE:   BP 120/70 (BP Location: Left Arm, Patient Position: Sitting, Cuff Size: Normal)   Pulse 81   Temp 98.8 F (37.1 C) (Oral)   Ht 5\' 8"  (1.727 m)   Wt 177 lb 12.8 oz (80.6 kg)   LMP 08/26/2021 (Approximate)   SpO2 99%   BMI 27.03 kg/m    General: Alert, no acute distress Cardio: Normal S1 and S2, RRR, no r/m/g Pulm: CTAB, normal work of breathing Abdomen: Bowel sounds normal. Abdomen soft and non-tender.  Extremities: No peripheral edema.  Neuro: Cranial nerves grossly intact     09/06/2021    3:10 PM 09/06/2021    2:32 PM 04/26/2021    8:49 AM 07/15/2019    2:33 PM  Depression screen PHQ 2/9  Decreased Interest 0 0 3 0  Down, Depressed, Hopeless 0  2 1  PHQ - 2 Score 0 0 5 1  Altered sleeping 1  0 0  Tired, decreased energy 1  2 0  Change in appetite 0  3 0  Feeling bad or  failure about yourself  0  2 0  Trouble concentrating 3  2 2   Moving slowly or fidgety/restless 0  1 0  Suicidal thoughts 0  0 0  PHQ-9 Score 5  15 3   Difficult doing work/chores Somewhat difficult  Not difficult at all Very difficult     ASSESSMENT/PLAN:   Anxiety PHQ 9 scores has decreased from last screening. -Restart Zoloft 50 mg daily -Continue to follow up with therapy  -Strict return precautions provided -Follow up in 2 weeks  Chronic constipation No abdominal pain today. -Recommend Miralax 1 scoop daily, can increase to BID until normal BM -Increase soluble fiber, fruits and vegetables daily -Increase water intake to 64 oz daily -Increase exercise daily -Avoid straining when having BM -Follow up with PCP in no improvement  Class 1 obesity Elevated BMI.  [facility] -Continue to promote healthy lifestyle and weight loss    HCM PAP due, patient aware to schedule appointment Declined Hep C screening today   PDMP Reviewed  4/9, MD

## 2021-09-06 NOTE — Patient Instructions (Signed)
It was a pleasure meeting you today. Thank you for allowing me to take part in your health care.  Our goals for today as we discussed include:  For your anxiety Restart Zoloft 50 mg daily.  Some side effects of this medication include constipation and appetite changes Recommend to follow up with therapist to help with anxiety management   For your constipation Increase water intake to at least 64 ounces daily Increase soluble fiber, fruits and vegetables Can try Miralax 1 capful daily until bowels are regulated.  If needed can increase to twice daily after 2-3 days if no bowel movement.  If you start having any loose stool or diarrhea cut back to daily or every other day.   Your PAP smear is overdue.  Please schedule an appointment at your earliest convenience.  Please follow-up with PCP in 2 weeks  If you have any questions or concerns, please do not hesitate to call the office at [phone].  I look forward to our next visit and until then take care and stay safe.  Regards,   Dana Allan, MD   [facility] [place]  Constipation, Adult Constipation is when a person has trouble pooping (having a bowel movement). When you have this condition, you may poop fewer than 3 times a week. Your poop (stool) may also be dry, hard, or bigger than normal. Follow these instructions at home: Eating and drinking  Eat foods that have a lot of fiber, such as: Fresh fruits and vegetables. Whole grains. Beans. Eat less of foods that are low in fiber and high in fat and sugar, such as: [place] fries. Hamburgers. Cookies. Candy. Soda. Drink enough fluid to keep your pee (urine) pale yellow. General instructions Exercise regularly or as told by your doctor. Try to do 150 minutes of exercise each week. Go to the restroom when you feel like you need to poop. Do not hold it in. Take over-the-counter and prescription medicines only as told by your doctor. These include any fiber  supplements. When you poop: Do deep breathing while relaxing your lower belly (abdomen). Relax your pelvic floor. The pelvic floor is a group of muscles that support the rectum, bladder, and intestines (as well as the uterus in women). Watch your condition for any changes. Tell your doctor if you notice any. Keep all follow-up visits as told by your doctor. This is important. Contact a doctor if: You have pain that gets worse. You have a fever. You have not pooped for 4 days. You vomit. You are not hungry. You lose weight. You are bleeding from the opening of the butt (anus). You have thin, pencil-like poop. Get help right away if: You have a fever, and your symptoms suddenly get worse. You leak poop or have blood in your poop. Your belly feels hard or bigger than normal (bloated). You have very bad belly pain. You feel dizzy or you faint. Summary Constipation is when a person poops fewer than 3 times a week, has trouble pooping, or has poop that is dry, hard, or bigger than normal. Eat foods that have a lot of fiber. Drink enough fluid to keep your pee (urine) pale yellow. Take over-the-counter and prescription medicines only as told by your doctor. These include any fiber supplements. This information is not intended to replace advice given to you by your health care provider. Make sure you discuss any questions you have with your health care provider. Document Revised: 11/19/2018 Document Reviewed: 11/19/2018 Elsevier Patient Education  2023 Elsevier Inc.  

## 2021-09-07 ENCOUNTER — Encounter (INDEPENDENT_AMBULATORY_CARE_PROVIDER_SITE_OTHER): Payer: Self-pay | Admitting: Family Medicine

## 2021-09-07 ENCOUNTER — Ambulatory Visit (INDEPENDENT_AMBULATORY_CARE_PROVIDER_SITE_OTHER): Payer: 59 | Admitting: Family Medicine

## 2021-09-07 VITALS — BP 120/80 | HR 77 | Temp 99.1°F | Ht 68.0 in | Wt 172.0 lb

## 2021-09-07 DIAGNOSIS — Z6826 Body mass index (BMI) 26.0-26.9, adult: Secondary | ICD-10-CM

## 2021-09-07 DIAGNOSIS — Z9189 Other specified personal risk factors, not elsewhere classified: Secondary | ICD-10-CM

## 2021-09-07 DIAGNOSIS — E559 Vitamin D deficiency, unspecified: Secondary | ICD-10-CM | POA: Diagnosis not present

## 2021-09-07 DIAGNOSIS — E8881 Metabolic syndrome: Secondary | ICD-10-CM

## 2021-09-07 DIAGNOSIS — E669 Obesity, unspecified: Secondary | ICD-10-CM | POA: Diagnosis not present

## 2021-09-07 DIAGNOSIS — E7849 Other hyperlipidemia: Secondary | ICD-10-CM

## 2021-09-07 DIAGNOSIS — K5909 Other constipation: Secondary | ICD-10-CM

## 2021-09-07 MED ORDER — METFORMIN HCL 500 MG PO TABS: ORAL_TABLET | ORAL | 0 refills | Status: DC

## 2021-09-14 NOTE — Progress Notes (Signed)
Chief Complaint:   OBESITY Kelly Kelly is here to discuss her progress with her obesity treatment plan along with follow-up of her obesity related diagnoses. Kelly Kelly is on the Category 2 Plan and states she is following her eating plan approximately 70% of the time. Kelly Kelly states she is doing cardio and strength training 30 minutes 2 times per week.  Today's visit was #: 7 Starting weight: 198 Starting date: 04/26/2021 Today's weight: 172 lbs Today's date: 09/07/2021 Total lbs lost to date: 26 Total lbs lost since last in-office visit: 6  Interim History: Kelly Kelly is having some hunger here and there after breakfast and after lunch. Upon review of her Lose It app, pt is hitting 1200-1400 calories and 110-170 grams of protein per day. She is doing great!  Subjective:   1. Other hyperlipidemia Rafeef has hyperlipidemia and has been trying to improve her cholesterol levels with intensive lifestyle modifications including a low saturated fat diet, exercise, and weight loss. She denies any chest pain, claudication, or myalgias.  2. Vitamin D deficiency Her Vit D level was 26. She is currently taking prescription vitamin D 50,000 IU each week. She denies nausea, vomiting or muscle weakness.  3. Insulin resistance Goal is HgbA1c < 5.7, fasting insulin closer to 5.    4. Other constipation Lately, pt is only having bowel movements every 4-5 days. Normally, she has a bowel movement every 2-3 days.  5. At risk for malnutrition Kelly Kelly is at increased risk for malnutrition due to inadequate nutritional intake.  Assessment/Plan:  No orders of the defined types were placed in this encounter.   There are no discontinued medications.   Meds ordered this encounter  Medications   metFORMIN (GLUCOPHAGE) 500 MG tablet    Sig: 1/2 tab at Brkfst and 1/2 po with lunch daily    Dispense:  30 tablet    Refill:  0    30 d supply;  ** OV for RF **   Do not send RF request     1. Other  hyperlipidemia Cardiovascular risk and specific lipid/LDL goals reviewed.  We discussed several lifestyle modifications today and Jakiera will continue to work on diet, exercise and weight loss efforts. Orders and follow up as documented in patient record.  Pt had an elevated LDL on last check 4 months ago. Check fasting labs at next OV.  Counseling Intensive lifestyle modifications are the first line treatment for this issue. Dietary changes: Increase soluble fiber. Decrease simple carbohydrates. Exercise changes: Moderate to vigorous-intensity aerobic activity 150 minutes per week if tolerated. Lipid-lowering medications: see documented in medical record.  2. Vitamin D deficiency Low Vitamin D level contributes to fatigue and are associated with obesity, breast, and colon cancer. She agrees to continue to take prescription Vitamin D @50 ,000 IU every week and will follow-up for routine testing of Vitamin D, at least 2-3 times per year to avoid over-replacement. Check fasting labs at next OV.  3. Insulin resistance Kelly Kelly will continue to work on weight loss, exercise, and decreasing simple carbohydrates to help decrease the risk of diabetes. Unice agreed to follow-up with [name] as directed to closely monitor her progress. Start low dose Metformin 250 mg every day. Pt will, however, start with 1 tab at breakfast for 3-4 days, then twice a day. Check fasting labs at next OV.  Start- metFORMIN (GLUCOPHAGE) 500 MG tablet; 1/2 tab at Brkfst and 1/2 po with lunch daily  Dispense: 30 tablet; Refill: 0  4. Other constipation Kelly Kelly  was informed that a decrease in bowel movement frequency is normal while losing weight, but stools should not be hard or painful. Orders and follow up as documented in patient record.  Start Miralax twice a day until bowel movements become normal. Drink 90+ oz of water per day. Take magnesium supplement via CALM or liquid 325-650 mg daily. Check fasting labs at next  OV.  Counseling Getting to Good Bowel Health: Your goal is to have one soft bowel movement each day. Drink at least 8 glasses of water each day. Eat plenty of fiber (goal is over 25 grams each day). It is best to get most of your fiber from dietary sources which includes leafy green vegetables, fresh fruit, and whole grains. You may need to add fiber with the help of OTC fiber supplements. These include Metamucil, Citrucel, and Flaxseed. If you are still having trouble, try adding Miralax or Magnesium Citrate. If all of these changes do not work, Dietitian.  5. At risk for malnutrition Kelly Kelly was given extensive malnutrition prevention education and counseling today of more than 9 minutes.  Counseled her that malnutrition refers to inappropriate nutrients or not the right balance of nutrients for optimal health.  Discussed with Kelly Kelly that it is absolutely possible to be malnourished but yet obese.  Risk factors, including but not limited to, inappropriate dietary choices, difficulty with obtaining food due to physical or financial limitations, and various physical and mental health conditions were reviewed with [name]   6. Obesity, Current BMI 26.2 Kelly Kelly is currently in the action stage of change. As such, her goal is to continue with weight loss efforts. She has agreed to the Category 2 Plan and keeping a food journal and adhering to recommended goals of 1200-1400 calories and 100+ grams protein.   Increase water intake. Continue journaling and bring log to OV. Start Metformin.  Exercise goals: For substantial health benefits, adults should do at least 150 minutes (2 hours and 30 minutes) a week of moderate-intensity, or 75 minutes (1 hour and 15 minutes) a week of vigorous-intensity aerobic physical activity, or an equivalent combination of moderate- and vigorous-intensity aerobic activity. Aerobic activity should be performed in episodes of at least 10  minutes, and preferably, it should be spread throughout the week.  Behavioral modification strategies: keeping a strict food journal.  Kelly Kelly has agreed to follow-up with our clinic in 3 weeks, fasting for blood work. She was informed of the importance of frequent follow-up visits to maximize her success with intensive lifestyle modifications for her multiple health conditions.   Objective:   Blood pressure 120/80, pulse 77, temperature 99.1 F (37.3 C), height 5\' 8"  (1.727 m), weight 172 lb (78 kg), last menstrual period 08/26/2021, SpO2 100 %, currently breastfeeding. Body mass index is 26.15 kg/m.  General: Cooperative, alert, well developed, in no acute distress. HEENT: Conjunctivae and lids unremarkable. Cardiovascular: Regular rhythm.  Lungs: Normal work of breathing. Neurologic: No focal deficits.   Lab Results  Component Value Date   CREATININE 0.74 04/26/2021   BUN 11 04/26/2021   NA 138 04/26/2021   K 4.2 04/26/2021   CL 101 04/26/2021   CO2 22 04/26/2021   Lab Results  Component Value Date   ALT 13 04/26/2021   AST 14 04/26/2021   ALKPHOS 75 04/26/2021   BILITOT <0.2 04/26/2021   Lab Results  Component Value Date   HGBA1C 5.6 04/26/2021   Lab Results  Component Value Date   INSULIN 7.0  04/26/2021   Lab Results  Component Value Date   TSH 1.020 04/26/2021   Lab Results  Component Value Date   CHOL 171 04/26/2021   HDL 49 04/26/2021   LDLCALC 110 (H) 04/26/2021   TRIG 59 04/26/2021   Lab Results  Component Value Date   VD25OH 26.0 (L) 04/26/2021   Lab Results  Component Value Date   WBC 5.1 04/26/2021   HGB 13.0 04/26/2021   HCT 39.4 04/26/2021   MCV 90 04/26/2021   PLT 348 04/26/2021    Attestation Statements:   Reviewed by clinician on day of visit: allergies, medications, problem list, medical history, surgical history, family history, social history, and previous encounter notes.  I, Kyung Rudd, BS, CMA, am acting as  transcriptionist for [employer], DO.   I have reviewed the above documentation for accuracy and completeness, and I agree with the above. Carlye Grippe, D.O.  The 21st Century Cures Act was signed into law in 2016 which includes the topic of electronic health records.  This provides immediate access to information in MyChart.  This includes consultation notes, operative notes, office notes, lab results and pathology reports.  If you have any questions about what you read please let Kelly Kelly know at your next visit so we can discuss your concerns and take corrective action if need be.  We are right here with you.

## 2021-09-15 ENCOUNTER — Encounter: Payer: Self-pay | Admitting: Family Medicine

## 2021-09-15 DIAGNOSIS — E669 Obesity, unspecified: Secondary | ICD-10-CM | POA: Insufficient documentation

## 2021-09-15 DIAGNOSIS — E66811 Obesity, class 1: Secondary | ICD-10-CM | POA: Insufficient documentation

## 2021-09-15 NOTE — Assessment & Plan Note (Signed)
Elevated BMI.  [facility] -Continue to promote healthy lifestyle and weight loss

## 2021-09-15 NOTE — Assessment & Plan Note (Signed)
PHQ 9 scores has decreased from last screening. -Restart Zoloft 50 mg daily -Continue to follow up with therapy  -Strict return precautions provided -Follow up in 2 weeks

## 2021-09-15 NOTE — Assessment & Plan Note (Signed)
No abdominal pain today. -Recommend Miralax 1 scoop daily, can increase to BID until normal BM -Increase soluble fiber, fruits and vegetables daily -Increase water intake to 64 oz daily -Increase exercise daily -Avoid straining when having BM -Follow up with PCP in no improvement

## 2021-09-25 ENCOUNTER — Encounter: Payer: Self-pay | Admitting: Family Medicine

## 2021-09-25 ENCOUNTER — Ambulatory Visit (INDEPENDENT_AMBULATORY_CARE_PROVIDER_SITE_OTHER): Payer: 59 | Admitting: Family Medicine

## 2021-09-25 VITALS — BP 124/78 | HR 70 | Temp 98.4°F | Ht 68.0 in | Wt 173.0 lb

## 2021-09-25 DIAGNOSIS — Z23 Encounter for immunization: Secondary | ICD-10-CM | POA: Diagnosis not present

## 2021-09-25 DIAGNOSIS — R69 Illness, unspecified: Secondary | ICD-10-CM | POA: Diagnosis not present

## 2021-09-25 DIAGNOSIS — F419 Anxiety disorder, unspecified: Secondary | ICD-10-CM | POA: Diagnosis not present

## 2021-09-25 MED ORDER — SERTRALINE HCL 50 MG PO TABS: 75.0000 mg | ORAL_TABLET | Freq: Every day | ORAL | 0 refills | Status: DC

## 2021-09-25 NOTE — Progress Notes (Signed)
    SUBJECTIVE:   CHIEF COMPLAINT / HPI: follow up anxiety  Presents for follow up for anxiety. Seen in clinic on 08/23 and restarted Zoloft 50 mg daily.  Since then patient reports improvement in symptoms.  She continues to have some feelings of irritation.  Denies any SI/HI.  No tearfulness, decreased energy, fatigue, sleep or decrease in appetite.  Has review today for new position at work.  This will have more responsibility.  PERTINENT  PMH / PSH:  Anxiety/Depression  OBJECTIVE:   BP 124/78 (BP Location: Left Arm, Patient Position: Sitting, Cuff Size: Normal)   Pulse 70   Temp 98.4 F (36.9 C) (Oral)   Ht 5\' 8"  (1.727 m)   Wt 173 lb (78.5 kg)   LMP 09/15/2021 (Approximate)   SpO2 99%   BMI 26.30 kg/m    General: Alert, no acute distress Cardio: Normal S1 and S2, RRR, no r/m/g Psych: Mood good, makes good eye contact, engaging in conversation, speech normal.  No SI/HI ASSESSMENT/PLAN:   Anxiety PHQ 2 screening negative today.  Has been tolerating Zoloft 50 mg daily.  Continues to have some irritated feelings.  Denies any SI/HI. -Increase Zoloft to 75 mg daily -Patient will MyChart me in 2 to 3 weeks if needing to increase to 100 mg. -Recommend counseling, can find therapist on  11/15/2021.  -Follow-up in 6 weeks or sooner if needed. -Strict return precautions provided   HCM PAP due, patient to schedule appointment  [email or web address], MD

## 2021-09-25 NOTE — Patient Instructions (Addendum)
It was a pleasure meeting you today. Thank you for allowing me to take part in your health care.  Our goals for today as we discussed include:  For your anxiety Increase Zoloft to 75 mg daily Recommend looking at website psychologytoday.com This is a good site to search for therapists can help you with your anxiety and ways to decrease stress. If you have any urgent mental health issues please go to the emergency department at [facility] Follow-up with virtual visit or MyChart message if you wish to increase your Zoloft to 100 mg daily in 2 to 3 weeks.   Your Pap smear is due.  Please schedule an appointment to have this completed 2 weeks after your next period.  You received flu vaccine today  Please follow-up with PCP in 6 weeks  If you have any questions or concerns, please do not hesitate to call the office at [phone].  I look forward to our next visit and until then take care and stay safe.  Regards,   Dana Allan, MD   [facility]

## 2021-09-25 NOTE — Assessment & Plan Note (Signed)
PHQ 2 screening negative today.  Has been tolerating Zoloft 50 mg daily.  Continues to have some irritated feelings.  Denies any SI/HI. -Increase Zoloft to 75 mg daily -Patient will MyChart me in 2 to 3 weeks if needing to increase to 100 mg. -Recommend counseling, can find therapist on  [email or web address].  -Follow-up in 6 weeks or sooner if needed. -Strict return precautions provided

## 2021-10-09 ENCOUNTER — Encounter (INDEPENDENT_AMBULATORY_CARE_PROVIDER_SITE_OTHER): Payer: Self-pay | Admitting: Family Medicine

## 2021-10-09 ENCOUNTER — Ambulatory Visit (INDEPENDENT_AMBULATORY_CARE_PROVIDER_SITE_OTHER): Payer: 59 | Admitting: Family Medicine

## 2021-10-09 VITALS — BP 119/75 | HR 74 | Temp 98.3°F | Ht 68.0 in | Wt 166.0 lb

## 2021-10-09 DIAGNOSIS — Z6825 Body mass index (BMI) 25.0-25.9, adult: Secondary | ICD-10-CM | POA: Diagnosis not present

## 2021-10-09 DIAGNOSIS — E559 Vitamin D deficiency, unspecified: Secondary | ICD-10-CM | POA: Diagnosis not present

## 2021-10-09 DIAGNOSIS — E669 Obesity, unspecified: Secondary | ICD-10-CM | POA: Diagnosis not present

## 2021-10-09 DIAGNOSIS — F411 Generalized anxiety disorder: Secondary | ICD-10-CM | POA: Diagnosis not present

## 2021-10-09 DIAGNOSIS — E7849 Other hyperlipidemia: Secondary | ICD-10-CM | POA: Diagnosis not present

## 2021-10-09 DIAGNOSIS — Z6830 Body mass index (BMI) 30.0-30.9, adult: Secondary | ICD-10-CM

## 2021-10-09 DIAGNOSIS — E8881 Metabolic syndrome: Secondary | ICD-10-CM | POA: Diagnosis not present

## 2021-10-09 DIAGNOSIS — Z9189 Other specified personal risk factors, not elsewhere classified: Secondary | ICD-10-CM

## 2021-10-09 DIAGNOSIS — R69 Illness, unspecified: Secondary | ICD-10-CM | POA: Diagnosis not present

## 2021-10-09 DIAGNOSIS — E88819 Insulin resistance, unspecified: Secondary | ICD-10-CM

## 2021-10-09 MED ORDER — VITAMIN D (ERGOCALCIFEROL) 1.25 MG (50000 UNIT) PO CAPS: 50000.0000 [IU] | ORAL_CAPSULE | ORAL | 0 refills | Status: DC

## 2021-10-10 LAB — INSULIN, RANDOM: INSULIN: 5.5 u[IU]/mL (ref 2.6–24.9)

## 2021-10-10 LAB — LIPID PANEL WITH LDL/HDL RATIO
Cholesterol, Total: 201 mg/dL — ABNORMAL HIGH (ref 100–199)
HDL: 50 mg/dL (ref >39)
LDL Chol Calc (NIH): 141 mg/dL — ABNORMAL HIGH (ref 0–99)
LDL/HDL Ratio: 2.8 ratio (ref 0.0–3.2)
Triglycerides: 57 mg/dL (ref 0–149)
VLDL Cholesterol Cal: 10 mg/dL (ref 5–40)

## 2021-10-10 LAB — HEMOGLOBIN A1C
Est. average glucose Bld gHb Est-mCnc: 111 mg/dL
Hgb A1c MFr Bld: 5.5 % (ref 4.8–5.6)

## 2021-10-10 LAB — VITAMIN D 25 HYDROXY (VIT D DEFICIENCY, FRACTURES): Vit D, 25-Hydroxy: 71.8 ng/mL (ref 30.0–100.0)

## 2021-10-11 NOTE — Progress Notes (Signed)
Chief Complaint:   OBESITY Kelly Kelly is here to discuss her progress with her obesity treatment plan along with follow-up of her obesity related diagnoses. Alan is on the Category 2 Plan and keeping a food journal and adhering to recommended goals of 1200-1400 calories and 100+ grams protein and states she is following her eating plan approximately 80% of the time. Asjah states she is doing cardio and weights 60 minutes 1 times per week.  Today's visit was #: 8 Starting weight: 198 lbs Starting date: 04/26/2021 Today's weight: 166 lbs Today's date: 10/09/2021 Total lbs lost to date: 32 Total lbs lost since last in-office visit: 6  Interim History: [name] is here for a follow up office visit.  We reviewed her meal plan and all questions were answered.  Patient's food recall appears to be accurate and consistent with what is on plan when she is following it.   When eating on plan, her hunger and cravings are well controlled.   Pt is following category 2 mostly , but when she eats off plan, she does journal.  Subjective:   1. Insulin resistance Pt started Metformin at last OV and is tolerating it well. It is helping with thoughts of foods, helps with cravings, and pt is able to make better choices.  2. Other hyperlipidemia [name] has had elevated LDL in the past.   3. GAD (generalized anxiety disorder) PCP restarted Zoloft recently approximately a month ago on 09/25/2021. She is tolerating it well, and generalized anxiety disorder is slightly better already.  4. Vitamin D deficiency [name] is tolerating medication(s) well without side effects.  Medication compliance is good as patient endorses taking it as prescribed. The patient denies additional concerns regarding this condition.      5. At risk for deficient intake of food [name] is at risk for deficient intake of food due to occasionally skipping foods on plan.  Assessment/Plan:   Orders  Placed This Encounter  Procedures   VITAMIN D 25 Hydroxy (Vit-D Deficiency, Fractures)   Lipid Panel With LDL/HDL Ratio   Insulin, random   Hemoglobin A1c    Medications Discontinued During This Encounter  Medication Reason   Vitamin D, Ergocalciferol, (DRISDOL) 1.25 MG (50000 UNIT) CAPS capsule Reorder     Meds ordered this encounter  Medications   Vitamin D, Ergocalciferol, (DRISDOL) 1.25 MG (50000 UNIT) CAPS capsule    Sig: Take 1 capsule (50,000 Units total) by mouth every 7 (seven) days.    Dispense:  4 capsule    Refill:  0    30 d supply;  ** OV for RF **   Do not send RF request     1. Insulin resistance [name] will continue to work on weight loss, exercise, and decreasing simple carbohydrates to help decrease the risk of diabetes. [name] agreed to follow-up with [name] as directed to closely monitor her progress. Continue Metformin but increase to twice a day as tolerated.   Lab/Orders today or future: - Insulin, random - Hemoglobin A1c  2. Other hyperlipidemia Cardiovascular risk and specific lipid/LDL goals reviewed.  We discussed several lifestyle modifications today and [name] will continue to work on diet, exercise and weight loss efforts. Orders and follow up as documented in patient record.   Counseling Intensive lifestyle modifications are the first line treatment for this issue. Dietary changes: Increase soluble fiber. Decrease simple carbohydrates. Exercise changes: Moderate to vigorous-intensity aerobic activity 150 minutes per week if tolerated. Lipid-lowering medications: see documented in  medical record.  Lab/Orders today or future: - Lipid Panel With LDL/HDL Ratio  3. GAD (generalized anxiety disorder) Behavior modification techniques were discussed today to help [name] deal with her anxiety.  Orders and follow up as documented in patient record. Continue medication per PCP and increase exercise for stress relief.  4. Vitamin D deficiency Low  Vitamin D level contributes to fatigue and are associated with obesity, breast, and colon cancer. She agrees to continue to take prescription Vitamin D @50 ,000 IU every week and will follow-up for routine testing of Vitamin D, at least 2-3 times per year to avoid over-replacement.  Refill- Vitamin D, Ergocalciferol, (DRISDOL) 1.25 MG (50000 UNIT) CAPS capsule; Take 1 capsule (50,000 Units total) by mouth every 7 (seven) days.  Dispense: 4 capsule; Refill: 0  Lab/Orders today or future: - VITAMIN D 25 Hydroxy (Vit-D Deficiency, Fractures)  5. At risk for deficient intake of food [name] was given extensive education and counseling today of more than 10 minutes on risks associated with deficient food intake.  Counseled her on the importance of following our prescribed meal plan and eating adequate amounts of protein.  Discussed with [name] that inadequate food intake over longer periods of time can slow their metabolism down significantly.   6. Obesity, Current BMI 25.3 [name] is currently in the action stage of change. As such, her goal is to continue with weight loss efforts. She has agreed to the Category 2 Plan and/or keeping a food journal and adhering to recommended goals of 1200-1400 calories and 100+ grams protein.   Pt's goal is to drink 85 oz of water a day and take Miralax and Metformin more consistently.  Exercise goals:  As is  Behavioral modification strategies: increasing water intake, no skipping meals, and planning for success.  [name] has agreed to follow-up with our clinic in 3 weeks. She was informed of the importance of frequent follow-up visits to maximize her success with intensive lifestyle modifications for her multiple health conditions.   [name] was informed we would discuss her lab results at her next visit unless there is a critical issue that needs to be addressed sooner. [name] agreed to keep her next visit at the agreed upon time to discuss  these results.  Objective:   Blood pressure 119/75, pulse 74, temperature 98.3 F (36.8 C), height 5\' 8"  (1.727 m), weight 166 lb (75.3 kg), last menstrual period 09/15/2021, SpO2 100 %, currently breastfeeding. Body mass index is 25.24 kg/m.  General: Cooperative, alert, well developed, in no acute distress. HEENT: Conjunctivae and lids unremarkable. Cardiovascular: Regular rhythm.  Lungs: Normal work of breathing. Neurologic: No focal deficits.   Lab Results  Component Value Date   CREATININE 0.74 04/26/2021   BUN 11 04/26/2021   NA 138 04/26/2021   K 4.2 04/26/2021   CL 101 04/26/2021   CO2 22 04/26/2021   Lab Results  Component Value Date   ALT 13 04/26/2021   AST 14 04/26/2021   ALKPHOS 75 04/26/2021   BILITOT <0.2 04/26/2021   Lab Results  Component Value Date   HGBA1C 5.5 10/09/2021   HGBA1C 5.6 04/26/2021   Lab Results  Component Value Date   INSULIN 5.5 10/09/2021   INSULIN 7.0 04/26/2021   Lab Results  Component Value Date   TSH 1.020 04/26/2021   Lab Results  Component Value Date   CHOL 201 (H) 10/09/2021   HDL 50 10/09/2021   LDLCALC 141 (H) 10/09/2021   TRIG 57 10/09/2021  Lab Results  Component Value Date   VD25OH 71.8 10/09/2021   VD25OH 26.0 (L) 04/26/2021   Lab Results  Component Value Date   WBC 5.1 04/26/2021   HGB 13.0 04/26/2021   HCT 39.4 04/26/2021   MCV 90 04/26/2021   PLT 348 04/26/2021    Attestation Statements:   Reviewed by clinician on day of visit: allergies, medications, problem list, medical history, surgical history, family history, social history, and previous encounter notes.  I, Kathlene November, BS, CMA, am acting as transcriptionist for [employer], DO.   I have reviewed the above documentation for accuracy and completeness, and I agree with the above. Marjory Sneddon, D.O.  The [place] was signed into law in 2016 which includes the topic of electronic health records.  This provides  immediate access to information in MyChart.  This includes consultation notes, operative notes, office notes, lab results and pathology reports.  If you have any questions about what you read please let us know at your next visit so we can discuss your concerns and take corrective action if need be.  We are right here with you.

## 2021-10-24 ENCOUNTER — Encounter: Payer: Self-pay | Admitting: Obstetrics and Gynecology

## 2021-10-30 ENCOUNTER — Ambulatory Visit (INDEPENDENT_AMBULATORY_CARE_PROVIDER_SITE_OTHER): Payer: 59 | Admitting: Family Medicine

## 2021-10-31 ENCOUNTER — Ambulatory Visit: Payer: Self-pay | Admitting: Physician Assistant

## 2021-11-08 ENCOUNTER — Encounter (INDEPENDENT_AMBULATORY_CARE_PROVIDER_SITE_OTHER): Payer: Self-pay | Admitting: Family Medicine

## 2021-11-08 ENCOUNTER — Ambulatory Visit (INDEPENDENT_AMBULATORY_CARE_PROVIDER_SITE_OTHER): Payer: 59 | Admitting: Family Medicine

## 2021-11-08 VITALS — BP 126/85 | HR 75 | Temp 98.5°F | Ht 68.0 in | Wt 165.0 lb

## 2021-11-08 DIAGNOSIS — E559 Vitamin D deficiency, unspecified: Secondary | ICD-10-CM | POA: Diagnosis not present

## 2021-11-08 DIAGNOSIS — E88819 Insulin resistance, unspecified: Secondary | ICD-10-CM

## 2021-11-08 DIAGNOSIS — Z6825 Body mass index (BMI) 25.0-25.9, adult: Secondary | ICD-10-CM

## 2021-11-08 DIAGNOSIS — E7849 Other hyperlipidemia: Secondary | ICD-10-CM

## 2021-11-08 DIAGNOSIS — E669 Obesity, unspecified: Secondary | ICD-10-CM | POA: Diagnosis not present

## 2021-11-08 DIAGNOSIS — Z6830 Body mass index (BMI) 30.0-30.9, adult: Secondary | ICD-10-CM

## 2021-11-08 MED ORDER — METFORMIN HCL 500 MG PO TABS: ORAL_TABLET | ORAL | 0 refills | Status: DC

## 2021-11-08 MED ORDER — VITAMIN D (ERGOCALCIFEROL) 1.25 MG (50000 UNIT) PO CAPS: 50000.0000 [IU] | ORAL_CAPSULE | ORAL | 0 refills | Status: AC

## 2021-11-21 NOTE — Progress Notes (Signed)
Chief Complaint:   OBESITY Kelly Kelly is here to discuss her progress with her obesity treatment plan along with follow-up of her obesity related diagnoses. Kelly Kelly is on the Category 2 Plan or keeping a food journal and adhering to recommended goals of 1200-1400 calories and 100+ grams of protein and states she is following her eating plan approximately 50% of the time. Kelly Kelly states she is not currently exercising.  Today's visit was #: 9 Starting weight: 198 lbs Starting date: 04/26/2021 Today's weight: 165 lbs Today's date: 11/08/2021 Total lbs lost to date: 33 Total lbs lost since last in-office visit: 1  Interim History: Kelly Kelly Speaker is here for a follow up office visit.  We reviewed her meal plan and all questions were answered.  Patient's food recall appears to be accurate and consistent with what is on plan when she is following it.   When eating on plan, her hunger and cravings are well controlled.  She is here to review labs.   Subjective:   1. Insulin resistance Patient feels she loses too much weight on metformin.  She denies side effects.  She does not want to take it every day.  I discussed A1c and fasting insulin with the patient today.  2. Vitamin D deficiency Patient's last vitamin D level was 71.8, and prior to that was 26.0.  I discussed labs with the patient today.  3. Other hyperlipidemia Patient is following her meal plan for several months.  She is not on medications.  Her LDL has increased from 110 to now 141.  I discussed labs with the patient today.  Assessment/Plan:  No orders of the defined types were placed in this encounter.   Medications Discontinued During This Encounter  Medication Reason   metFORMIN (GLUCOPHAGE) 500 MG tablet Reorder   Vitamin D, Ergocalciferol, (DRISDOL) 1.25 MG (50000 UNIT) CAPS capsule Reorder     Meds ordered this encounter  Medications   metFORMIN (GLUCOPHAGE) 500 MG tablet    Sig: 1/2 tab at Brkfst and 1/2  po with lunch daily    Dispense:  30 tablet    Refill:  0    30 d supply;  ** OV for RF **   Do not send RF request   Vitamin D, Ergocalciferol, (DRISDOL) 1.25 MG (50000 UNIT) CAPS capsule    Sig: Take 1 capsule (50,000 Units total) by mouth every 7 (seven) days.    Dispense:  4 capsule    Refill:  0    30 d supply;  ** OV for RF **   Do not send RF request     1. Insulin resistance Patient prefers to only take metformin 4 to 5 days prior to her menses to help with hunger and cravings.  We will refill metformin for 1 month.  - metFORMIN (GLUCOPHAGE) 500 MG tablet; 1/2 tab at Brkfst and 1/2 po with lunch daily  Dispense: 30 tablet; Refill: 0  2. Vitamin D deficiency Patient's vitamin D level is at goal of 50-70.  She will continue Ergo as we go into the winter, and we will refill for 1 month with no change in dose.  - Vitamin D, Ergocalciferol, (DRISDOL) 1.25 MG (50000 UNIT) CAPS capsule; Take 1 capsule (50,000 Units total) by mouth every 7 (seven) days.  Dispense: 4 capsule; Refill: 0  3. Other hyperlipidemia Patient will start with walking and exercising, and she will work on decreasing her saturated and trans fats.  4. Obesity, Current BMI 25.1 Kelly Kelly  is currently in the action stage of change. As such, her goal is to continue with weight loss efforts. She has agreed to the Category 2 Plan.   Patient's goal is to walk 45 minutes 3 days/week.  Exercise goals: All adults should avoid inactivity. Some physical activity is better than none, and adults who participate in any amount of physical activity gain some health benefits.  Behavioral modification strategies: increasing lean protein intake, decreasing simple carbohydrates, and avoiding temptations.  Felicity has agreed to follow-up with our clinic in 2 months. She was informed of the importance of frequent follow-up visits to maximize her success with intensive lifestyle modifications for her multiple health conditions.    Objective:   Blood pressure 126/85, pulse 75, temperature 98.5 F (36.9 C), height 5\' 8"  (1.727 m), weight 165 lb (74.8 kg), SpO2 100 %, currently breastfeeding. Body mass index is 25.09 kg/m.  General: Cooperative, alert, well developed, in no acute distress. HEENT: Conjunctivae and lids unremarkable. Cardiovascular: Regular rhythm.  Lungs: Normal work of breathing. Neurologic: No focal deficits.   Lab Results  Component Value Date   CREATININE 0.74 04/26/2021   BUN 11 04/26/2021   NA 138 04/26/2021   K 4.2 04/26/2021   CL 101 04/26/2021   CO2 22 04/26/2021   Lab Results  Component Value Date   ALT 13 04/26/2021   AST 14 04/26/2021   ALKPHOS 75 04/26/2021   BILITOT <0.2 04/26/2021   Lab Results  Component Value Date   HGBA1C 5.5 10/09/2021   HGBA1C 5.6 04/26/2021   Lab Results  Component Value Date   INSULIN 5.5 10/09/2021   INSULIN 7.0 04/26/2021   Lab Results  Component Value Date   TSH 1.020 04/26/2021   Lab Results  Component Value Date   CHOL 201 (H) 10/09/2021   HDL 50 10/09/2021   LDLCALC 141 (H) 10/09/2021   TRIG 57 10/09/2021   Lab Results  Component Value Date   VD25OH 71.8 10/09/2021   VD25OH 26.0 (L) 04/26/2021   Lab Results  Component Value Date   WBC 5.1 04/26/2021   HGB 13.0 04/26/2021   HCT 39.4 04/26/2021   MCV 90 04/26/2021   PLT 348 04/26/2021   No results found for: "IRON", "TIBC", "FERRITIN"  Attestation Statements:   Reviewed by clinician on day of visit: allergies, medications, problem list, medical history, surgical history, family history, social history, and previous encounter notes.   Wilhemena Durie, am acting as transcriptionist for [employer], DO.   I have reviewed the above documentation for accuracy and completeness, and I agree with the above. Marjory Sneddon, D.O.  The [place] was signed into law in 2016 which includes the topic of electronic health records.  This provides  immediate access to information in MyChart.  This includes consultation notes, operative notes, office notes, lab results and pathology reports.  If you have any questions about what you read please let us know at your next visit so we can discuss your concerns and take corrective action if need be.  We are right here with you.

## 2021-11-22 ENCOUNTER — Ambulatory Visit: Payer: 59 | Admitting: Family Medicine

## 2021-11-22 ENCOUNTER — Telehealth: Payer: Self-pay | Admitting: Family Medicine

## 2021-11-22 MED ORDER — SERTRALINE HCL 50 MG PO TABS: 75.0000 mg | ORAL_TABLET | Freq: Every day | ORAL | 0 refills | Status: AC

## 2021-11-22 NOTE — Telephone Encounter (Signed)
Pt need refill on sertraline sent to [facility]

## 2021-11-22 NOTE — Telephone Encounter (Signed)
Refill sent in to [facility]   

## 2021-11-22 NOTE — Telephone Encounter (Signed)
noted 

## 2021-11-23 ENCOUNTER — Ambulatory Visit: Payer: 59 | Admitting: Family Medicine

## 2021-11-28 DIAGNOSIS — R69 Illness, unspecified: Secondary | ICD-10-CM | POA: Diagnosis not present

## 2021-12-04 DIAGNOSIS — Z8049 Family history of malignant neoplasm of other genital organs: Secondary | ICD-10-CM | POA: Diagnosis not present

## 2021-12-04 DIAGNOSIS — Z825 Family history of asthma and other chronic lower respiratory diseases: Secondary | ICD-10-CM | POA: Diagnosis not present

## 2021-12-04 DIAGNOSIS — Z83438 Family history of other disorder of lipoprotein metabolism and other lipidemia: Secondary | ICD-10-CM | POA: Diagnosis not present

## 2021-12-04 DIAGNOSIS — K5900 Constipation, unspecified: Secondary | ICD-10-CM | POA: Diagnosis not present

## 2021-12-04 DIAGNOSIS — Z8249 Family history of ischemic heart disease and other diseases of the circulatory system: Secondary | ICD-10-CM | POA: Diagnosis not present

## 2021-12-04 DIAGNOSIS — Z8042 Family history of malignant neoplasm of prostate: Secondary | ICD-10-CM | POA: Diagnosis not present

## 2021-12-04 DIAGNOSIS — E559 Vitamin D deficiency, unspecified: Secondary | ICD-10-CM | POA: Diagnosis not present

## 2021-12-04 DIAGNOSIS — Z823 Family history of stroke: Secondary | ICD-10-CM | POA: Diagnosis not present

## 2021-12-04 DIAGNOSIS — R69 Illness, unspecified: Secondary | ICD-10-CM | POA: Diagnosis not present

## 2021-12-04 DIAGNOSIS — R7303 Prediabetes: Secondary | ICD-10-CM | POA: Diagnosis not present

## 2021-12-28 ENCOUNTER — Ambulatory Visit: Payer: 59 | Admitting: Family Medicine

## 2022-02-01 DIAGNOSIS — F411 Generalized anxiety disorder: Secondary | ICD-10-CM | POA: Diagnosis not present

## 2022-02-01 DIAGNOSIS — F902 Attention-deficit hyperactivity disorder, combined type: Secondary | ICD-10-CM | POA: Diagnosis not present

## 2022-02-26 DIAGNOSIS — F411 Generalized anxiety disorder: Secondary | ICD-10-CM | POA: Diagnosis not present

## 2022-02-26 DIAGNOSIS — F902 Attention-deficit hyperactivity disorder, combined type: Secondary | ICD-10-CM | POA: Diagnosis not present

## 2022-03-07 NOTE — Patient Instructions (Signed)
It was a pleasure meeting you today. Thank you for allowing me to take part in your health care.  Our goals for today as we discussed include:  Will MyChart you the results of swabs today.  If needing treatment will send in prescription to your pharmacy.   We will get some labs today.  If they are abnormal or we need to do something about them, I will call you.  If they are normal, I will send you a message on MyChart (if it is active) or a letter in the mail.  If you don't hear from [name] in 2 weeks, please call the office at the number below.    If you have any questions or concerns, please do not hesitate to call the office at [phone].  I look forward to our next visit and until then take care and stay safe.  Regards,   Dana Allan, MD   [facility]

## 2022-03-07 NOTE — Progress Notes (Signed)
   SUBJECTIVE:   Chief Complaint  Patient presents with   Annual Exam   HPI Patient presents for PAP smear  Denies any vaginal discharge, dyspareunia or abnormal vaginal bleeding.  Husband with vasectomy for birth control  PERTINENT PMH / PSH: Mood disorder Overweight  OBJECTIVE:  BP 103/70   Pulse 87   Temp 98.2 F (36.8 C) (Oral)   Ht 5' 8"$  (1.727 m)   Wt 179 lb 9.6 oz (81.5 kg)   LMP 02/18/2022 (Exact Date)   SpO2 98%   Breastfeeding Unknown   BMI 27.31 kg/m    Physical Exam Exam conducted with a chaperone present.  Genitourinary:    General: Normal vulva.     Exam position: Lithotomy position.     Labia:        Right: No rash or lesion.        Left: No rash or lesion.      Vagina: Normal.     Cervix: Normal.     Uterus: Normal.      Adnexa: Right adnexa normal and left adnexa normal.     ASSESSMENT/PLAN:  Cervical cancer screening Assessment & Plan: UPT not indicated Vasectomy for contraception Pap smear: performed GC/Chlamydia/Trichomoniasis obtained  Orders: -     Cytology - PAP  Lipid screening -     Lipid panel  Thyroid disorder screening -     TSH  Encounter for hepatitis C screening test for low risk patient -     Hepatitis C antibody  Body mass index 28.0-28.9, adult -     CBC with Differential/Platelet -     Comprehensive metabolic panel   PDMP reviewed  No follow-ups on file.  Carollee Leitz, MD

## 2022-03-08 ENCOUNTER — Encounter: Payer: Self-pay | Admitting: Family Medicine

## 2022-03-08 ENCOUNTER — Ambulatory Visit: Payer: BC Managed Care – PPO | Admitting: Family Medicine

## 2022-03-08 ENCOUNTER — Other Ambulatory Visit (HOSPITAL_COMMUNITY)
Admission: RE | Admit: 2022-03-08 | Discharge: 2022-03-08 | Disposition: A | Discharge status: Home / Self Care | Payer: BC Managed Care – PPO | Source: Ambulatory Visit | Attending: Family Medicine | Admitting: Family Medicine

## 2022-03-08 VITALS — BP 103/70 | HR 87 | Temp 98.2°F | Ht 68.0 in | Wt 179.6 lb

## 2022-03-08 DIAGNOSIS — Z0000 Encounter for general adult medical examination without abnormal findings: Secondary | ICD-10-CM

## 2022-03-08 DIAGNOSIS — Z13220 Encounter for screening for lipoid disorders: Secondary | ICD-10-CM

## 2022-03-08 DIAGNOSIS — Z1329 Encounter for screening for other suspected endocrine disorder: Secondary | ICD-10-CM | POA: Diagnosis not present

## 2022-03-08 DIAGNOSIS — Z6828 Body mass index (BMI) 28.0-28.9, adult: Secondary | ICD-10-CM | POA: Diagnosis not present

## 2022-03-08 DIAGNOSIS — Z124 Encounter for screening for malignant neoplasm of cervix: Secondary | ICD-10-CM

## 2022-03-08 DIAGNOSIS — Z1159 Encounter for screening for other viral diseases: Secondary | ICD-10-CM

## 2022-03-08 LAB — COMPREHENSIVE METABOLIC PANEL
ALT: 16 U/L (ref 0–35)
AST: 12 U/L (ref 0–37)
Albumin: 4.1 g/dL (ref 3.5–5.2)
Alkaline Phosphatase: 69 U/L (ref 39–117)
BUN: 8 mg/dL (ref 6–23)
CO2: 28 mEq/L (ref 19–32)
Calcium: 9.2 mg/dL (ref 8.4–10.5)
Chloride: 102 mEq/L (ref 96–112)
Creatinine, Ser: 0.72 mg/dL (ref 0.40–1.20)
GFR: 108.69 mL/min (ref >60.00)
Glucose, Bld: 80 mg/dL (ref 70–99)
Potassium: 4.1 mEq/L (ref 3.5–5.1)
Sodium: 138 mEq/L (ref 135–145)
Total Bilirubin: 0.2 mg/dL (ref 0.2–1.2)
Total Protein: 7.5 g/dL (ref 6.0–8.3)

## 2022-03-08 LAB — CBC WITH DIFFERENTIAL/PLATELET
Basophils Absolute: 0 10*3/uL (ref 0.0–0.1)
Basophils Relative: 0.3 % (ref 0.0–3.0)
Eosinophils Absolute: 0.1 10*3/uL (ref 0.0–0.7)
Eosinophils Relative: 2.2 % (ref 0.0–5.0)
HCT: 39.2 % (ref 36.0–46.0)
Hemoglobin: 13.1 g/dL (ref 12.0–15.0)
Lymphocytes Relative: 35.4 % (ref 12.0–46.0)
Lymphs Abs: 1.1 10*3/uL (ref 0.7–4.0)
MCHC: 33.5 g/dL (ref 30.0–36.0)
MCV: 91.2 fl (ref 78.0–100.0)
Monocytes Absolute: 0.3 10*3/uL (ref 0.1–1.0)
Monocytes Relative: 9.7 % (ref 3.0–12.0)
Neutro Abs: 1.6 10*3/uL (ref 1.4–7.7)
Neutrophils Relative %: 52.4 % (ref 43.0–77.0)
Platelets: 342 10*3/uL (ref 150.0–400.0)
RBC: 4.3 Mil/uL (ref 3.87–5.11)
RDW: 13.3 % (ref 11.5–15.5)
WBC: 3.1 10*3/uL — ABNORMAL LOW (ref 4.0–10.5)

## 2022-03-08 LAB — LIPID PANEL
Cholesterol: 157 mg/dL (ref 0–200)
HDL: 48 mg/dL (ref >39.00)
LDL Cholesterol: 97 mg/dL (ref 0–99)
NonHDL: 109.15
Total CHOL/HDL Ratio: 3
Triglycerides: 59 mg/dL (ref 0.0–149.0)
VLDL: 11.8 mg/dL (ref 0.0–40.0)

## 2022-03-08 LAB — TSH: TSH: 0.93 u[IU]/mL (ref 0.35–5.50)

## 2022-03-09 LAB — CYTOLOGY - PAP
Chlamydia: NEGATIVE
Comment: NEGATIVE
Comment: NEGATIVE
Comment: NEGATIVE
Comment: NORMAL
Diagnosis: NEGATIVE
High risk HPV: NEGATIVE
Neisseria Gonorrhea: NEGATIVE
Trichomonas: NEGATIVE

## 2022-03-09 LAB — HEPATITIS C ANTIBODY: Hepatitis C Ab: NONREACTIVE

## 2022-03-11 ENCOUNTER — Encounter: Payer: Self-pay | Admitting: Family Medicine

## 2022-03-11 ENCOUNTER — Other Ambulatory Visit: Payer: Self-pay | Admitting: Family Medicine

## 2022-03-11 DIAGNOSIS — D709 Neutropenia, unspecified: Secondary | ICD-10-CM

## 2022-03-11 DIAGNOSIS — Z0000 Encounter for general adult medical examination without abnormal findings: Secondary | ICD-10-CM | POA: Insufficient documentation

## 2022-03-11 DIAGNOSIS — Z13220 Encounter for screening for lipoid disorders: Secondary | ICD-10-CM | POA: Insufficient documentation

## 2022-03-11 DIAGNOSIS — Z1159 Encounter for screening for other viral diseases: Secondary | ICD-10-CM | POA: Insufficient documentation

## 2022-03-11 DIAGNOSIS — Z124 Encounter for screening for malignant neoplasm of cervix: Secondary | ICD-10-CM | POA: Insufficient documentation

## 2022-03-11 DIAGNOSIS — Z1329 Encounter for screening for other suspected endocrine disorder: Secondary | ICD-10-CM | POA: Insufficient documentation

## 2022-03-11 NOTE — Assessment & Plan Note (Signed)
UPT not indicated Vasectomy for contraception Pap smear: performed GC/Chlamydia/Trichomoniasis obtained

## 2022-03-26 DIAGNOSIS — F902 Attention-deficit hyperactivity disorder, combined type: Secondary | ICD-10-CM | POA: Diagnosis not present

## 2022-03-26 DIAGNOSIS — F411 Generalized anxiety disorder: Secondary | ICD-10-CM | POA: Diagnosis not present

## 2022-04-23 DIAGNOSIS — F411 Generalized anxiety disorder: Secondary | ICD-10-CM | POA: Diagnosis not present

## 2022-04-23 DIAGNOSIS — F902 Attention-deficit hyperactivity disorder, combined type: Secondary | ICD-10-CM | POA: Diagnosis not present

## 2022-05-07 DIAGNOSIS — F902 Attention-deficit hyperactivity disorder, combined type: Secondary | ICD-10-CM | POA: Diagnosis not present

## 2022-05-07 DIAGNOSIS — F411 Generalized anxiety disorder: Secondary | ICD-10-CM | POA: Diagnosis not present

## 2022-05-28 DIAGNOSIS — F411 Generalized anxiety disorder: Secondary | ICD-10-CM | POA: Diagnosis not present

## 2022-05-28 DIAGNOSIS — F902 Attention-deficit hyperactivity disorder, combined type: Secondary | ICD-10-CM | POA: Diagnosis not present

## 2022-06-12 DIAGNOSIS — F902 Attention-deficit hyperactivity disorder, combined type: Secondary | ICD-10-CM | POA: Diagnosis not present

## 2022-06-12 DIAGNOSIS — F411 Generalized anxiety disorder: Secondary | ICD-10-CM | POA: Diagnosis not present

## 2022-07-10 DIAGNOSIS — F902 Attention-deficit hyperactivity disorder, combined type: Secondary | ICD-10-CM | POA: Diagnosis not present

## 2022-07-10 DIAGNOSIS — F411 Generalized anxiety disorder: Secondary | ICD-10-CM | POA: Diagnosis not present

## 2022-10-26 DIAGNOSIS — F411 Generalized anxiety disorder: Secondary | ICD-10-CM | POA: Diagnosis not present

## 2022-10-26 DIAGNOSIS — F902 Attention-deficit hyperactivity disorder, combined type: Secondary | ICD-10-CM | POA: Diagnosis not present

## 2022-10-29 ENCOUNTER — Encounter: Payer: Self-pay | Admitting: Family Medicine

## 2022-10-29 ENCOUNTER — Ambulatory Visit: Payer: BC Managed Care – PPO | Admitting: Family Medicine

## 2022-10-29 VITALS — BP 114/66 | HR 85 | Temp 97.9°F | Resp 16 | Ht 67.0 in | Wt 201.2 lb

## 2022-10-29 DIAGNOSIS — E88819 Insulin resistance, unspecified: Secondary | ICD-10-CM

## 2022-10-29 DIAGNOSIS — Z23 Encounter for immunization: Secondary | ICD-10-CM | POA: Diagnosis not present

## 2022-10-29 DIAGNOSIS — D709 Neutropenia, unspecified: Secondary | ICD-10-CM

## 2022-10-29 DIAGNOSIS — N939 Abnormal uterine and vaginal bleeding, unspecified: Secondary | ICD-10-CM

## 2022-10-29 DIAGNOSIS — N920 Excessive and frequent menstruation with regular cycle: Secondary | ICD-10-CM

## 2022-10-29 DIAGNOSIS — R7309 Other abnormal glucose: Secondary | ICD-10-CM | POA: Diagnosis not present

## 2022-10-29 DIAGNOSIS — Z6831 Body mass index (BMI) 31.0-31.9, adult: Secondary | ICD-10-CM | POA: Diagnosis not present

## 2022-10-29 DIAGNOSIS — E66811 Obesity, class 1: Secondary | ICD-10-CM

## 2022-10-29 HISTORY — DX: Abnormal uterine and vaginal bleeding, unspecified: N93.9

## 2022-10-29 HISTORY — DX: Excessive and frequent menstruation with regular cycle: N92.0

## 2022-10-29 HISTORY — DX: Neutropenia, unspecified: D70.9

## 2022-10-29 LAB — CBC WITH DIFFERENTIAL/PLATELET
Basophils Absolute: 0 K/uL (ref 0.0–0.1)
Basophils Relative: 0.4 % (ref 0.0–3.0)
Eosinophils Absolute: 0.2 K/uL (ref 0.0–0.7)
Eosinophils Relative: 3 % (ref 0.0–5.0)
HCT: 37 % (ref 36.0–46.0)
Hemoglobin: 12 g/dL (ref 12.0–15.0)
Lymphocytes Relative: 35.6 % (ref 12.0–46.0)
Lymphs Abs: 2.1 K/uL (ref 0.7–4.0)
MCHC: 32.4 g/dL (ref 30.0–36.0)
MCV: 91.3 fl (ref 78.0–100.0)
Monocytes Absolute: 0.3 K/uL (ref 0.1–1.0)
Monocytes Relative: 5.7 % (ref 3.0–12.0)
Neutro Abs: 3.2 K/uL (ref 1.4–7.7)
Neutrophils Relative %: 55.3 % (ref 43.0–77.0)
Platelets: 370 K/uL (ref 150.0–400.0)
RBC: 4.05 Mil/uL (ref 3.87–5.11)
RDW: 13.7 % (ref 11.5–15.5)
WBC: 5.8 K/uL (ref 4.0–10.5)

## 2022-10-29 LAB — HEMOGLOBIN A1C: Hgb A1c MFr Bld: 5.8 % (ref 4.6–6.5)

## 2022-10-29 LAB — TSH: TSH: 1.05 u[IU]/mL (ref 0.35–5.50)

## 2022-10-29 NOTE — Patient Instructions (Addendum)
It was a pleasure meeting you today. Thank you for allowing me to take part in your health care.  Our goals for today as we discussed include:  We will get some labs today.  If they are abnormal or we need to do something about them, I will call you.  If they are normal, I will send you a message on MyChart (if it is active) or a letter in the mail.  If you don't hear from [name] in 2 weeks, please call the office at the number below.   Received Flu vaccine  If continues to have heavy periods please follow up for evaluation  If you have any questions or concerns, please do not hesitate to call the office at [phone].  I look forward to our next visit and until then take care and stay safe.  Regards,   Dana Allan, MD   [facility]

## 2022-10-29 NOTE — Assessment & Plan Note (Addendum)
Patient reports 1 episode heavy menstrual bleeding and pain during sex. Last Pap smear was normal. -Consider referral to OB/GYN for further evaluation and management. -Consider transvaginal ultrasound to assess for fibroids.

## 2022-10-29 NOTE — Assessment & Plan Note (Signed)
Patient has gained weight since discontinuing a diet plan with the healthy weight and wellness clinic. -Encourage return to healthy diet and exercise regimen.

## 2022-10-29 NOTE — Assessment & Plan Note (Signed)
Patient has a history of insulin resistance and is concerned about prediabetes due to weight gain. She has stopped taking Metformin. Last A1c was 5.3 and glucose was 80. -Order A1c to assess for prediabetes. -Consider reinitiating Metformin if A1c is elevated.

## 2022-10-29 NOTE — Progress Notes (Signed)
SUBJECTIVE:   Chief Complaint  Patient presents with   Abnormal Lab    Wants to talk about getting blood work for prediabetes    HPI Presents for acute visit  Discussed the use of AI scribe software for clinical note transcription with the patient, who gave verbal consent to proceed.  History of Present Illness The patient, previously diagnosed with insulin resistance, presents with concerns about potential prediabetes or diabetes due to recent weight gain. She reports a history of prediabetes and insulin resistance, but has not been following the same diet as when she was under the care of the healthy weight and wellness clinic. The patient stopped attending the clinic due to her husband's insurance issues and her own job change. She has not been taking metformin, previously prescribed for insulin resistance, as she did not want to feel dependent on it and was concerned about potential weight gain upon discontinuation.  The patient also reports heavy menstrual bleeding, to the point of soaking through pads and clothing. She describes her periods as lasting seven days and has been experiencing pain during intercourse. She has not seen an OB/GYN since her last childbirth and has not been on birth control. She has a history of cystic ovaries, but it has been a long time since her last evaluation.  The patient is currently taking vitamin D and Zoloft, prescribed by an online provider, and has recently had a prescription for Adderall filled. She reports no increase in urination frequency, chest pain, or shortness of breath, but has been experiencing significant gas pains.    PERTINENT PMH / PSH: As above  OBJECTIVE:  BP 114/66   Pulse 85   Temp 97.9 F (36.6 C)   Resp 16   Ht 5\' 7"  (1.702 m)   Wt 201 lb 4 oz (91.3 kg)   LMP 10/15/2022 (Approximate)   SpO2 97%   Breastfeeding No   BMI 31.52 kg/m    Physical Exam Vitals reviewed.  Constitutional:      General: She is not in  acute distress.    Appearance: Normal appearance. She is obese. She is not ill-appearing, toxic-appearing or diaphoretic.  Eyes:     General:        Right eye: No discharge.        Left eye: No discharge.     Conjunctiva/sclera: Conjunctivae normal.  Cardiovascular:     Rate and Rhythm: Normal rate and regular rhythm.     Heart sounds: Normal heart sounds.  Pulmonary:     Effort: Pulmonary effort is normal.     Breath sounds: Normal breath sounds.  Abdominal:     General: Bowel sounds are normal.  Musculoskeletal:        General: Normal range of motion.  Skin:    General: Skin is warm and dry.  Neurological:     General: No focal deficit present.     Mental Status: She is alert and oriented to person, place, and time. Mental status is at baseline.  Psychiatric:        Mood and Affect: Mood normal.        Behavior: Behavior normal.        Thought Content: Thought content normal.        Judgment: Judgment normal.        10/29/2022    8:39 AM 03/08/2022   10:21 AM 09/25/2021    8:09 AM 09/06/2021    3:10 PM 09/06/2021    2:32 PM  Depression screen PHQ 2/9  Decreased Interest 0 0 0 0 0  Down, Depressed, Hopeless 1 0 0 0   PHQ - 2 Score 1 0 0 0 0  Altered sleeping 0   1   Tired, decreased energy 1   1   Change in appetite 2   0   Feeling bad or failure about yourself  0   0   Trouble concentrating 3   3   Moving slowly or fidgety/restless 2   0   Suicidal thoughts 0   0   PHQ-9 Score 9   5   Difficult doing work/chores Somewhat difficult   Somewhat difficult       10/29/2022    8:40 AM 03/08/2022   10:22 AM 09/06/2021    3:23 PM  GAD 7 : Generalized Anxiety Score  Nervous, Anxious, on Edge 2 0 2  Control/stop worrying 2 0 3  Worry too much - different things 2 0 3  Trouble relaxing 2 0 2  Restless 2 0 1  Easily annoyed or irritable 3 0 3  Afraid - awful might happen 0 0 1  Total GAD 7 Score 13 0 15  Anxiety Difficulty Very difficult Not difficult at all Very  difficult    ASSESSMENT/PLAN:  Insulin resistance Assessment & Plan: Patient has a history of insulin resistance and is concerned about prediabetes due to weight gain. She has stopped taking Metformin. Last A1c was 5.3 and glucose was 80. -Order A1c to assess for prediabetes. -Consider reinitiating Metformin if A1c is elevated.   Need for influenza vaccination -     Flu vaccine trivalent PF, 6mos and older(Flulaval,Afluria,Fluarix,Fluzone)  Abnormal glucose -     Hemoglobin A1c  Neutropenia, unspecified type (HCC) -     CBC with Differential/Platelet  Class 1 obesity Assessment & Plan: Patient has gained weight since discontinuing a diet plan with the healthy weight and wellness clinic. -Encourage return to healthy diet and exercise regimen.   Menorrhagia with regular cycle Assessment & Plan: Patient reports 1 episode heavy menstrual bleeding and pain during sex. Last Pap smear was normal. -Consider referral to OB/GYN for further evaluation and management. -Consider transvaginal ultrasound to assess for fibroids.  Orders: -     TSH   PDMP reviewed  Return if symptoms worsen or fail to improve, for PCP.  Dana Allan, MD

## 2022-12-17 DIAGNOSIS — F902 Attention-deficit hyperactivity disorder, combined type: Secondary | ICD-10-CM | POA: Diagnosis not present

## 2022-12-17 DIAGNOSIS — F411 Generalized anxiety disorder: Secondary | ICD-10-CM | POA: Diagnosis not present

## 2023-01-10 ENCOUNTER — Ambulatory Visit: Payer: BC Managed Care – PPO | Admitting: Family Medicine

## 2023-01-10 ENCOUNTER — Encounter: Payer: Self-pay | Admitting: Family Medicine

## 2023-01-10 VITALS — BP 128/70 | HR 86 | Temp 98.0°F | Resp 18 | Ht 67.0 in | Wt 198.5 lb

## 2023-01-10 DIAGNOSIS — N6312 Unspecified lump in the right breast, upper inner quadrant: Secondary | ICD-10-CM

## 2023-01-10 DIAGNOSIS — N644 Mastodynia: Secondary | ICD-10-CM | POA: Diagnosis not present

## 2023-01-10 DIAGNOSIS — Z3009 Encounter for other general counseling and advice on contraception: Secondary | ICD-10-CM | POA: Insufficient documentation

## 2023-01-10 HISTORY — DX: Unspecified lump in the right breast, upper inner quadrant: N63.12

## 2023-01-10 LAB — POCT URINE PREGNANCY: Preg Test, Ur: NEGATIVE

## 2023-01-10 NOTE — Assessment & Plan Note (Signed)
New palpable mass in the right breast with associated pain and skin changes. No family history of breast cancer. Physical exam did not reveal any palpable masses, but due to patient's symptoms, further imaging is warranted. Suspect fibrocystic breast given was found during recent cycle but cannot rule out malignancy. -Order diagnostic mammogram.

## 2023-01-10 NOTE — Assessment & Plan Note (Signed)
Patient is seeking a long-term birth control method with minimal side effects. She has previously used Nexplanon and oral contraceptives, but had issues with both. She is not interested in IUD or Depo-Provera. Decision on birth control method is deferred until after breast imaging results due to potential estrogen-related risks. -Discuss birth control options after mammogram results. -If patient is interested in IUD or sterilization, refer to OB/GYN.

## 2023-01-10 NOTE — Patient Instructions (Addendum)
It was a pleasure meeting you today. Thank you for allowing me to take part in your health care.  Our goals for today as we discussed include:  Could not feel any lumps today.  Suspect Fibrocystic breast but will order diagnostic mammogram to confirm no mass If normal can discuss options for birth control  Referral sent for Mammogram. Please call to schedule appointment. [facility] [address] [place], [place] [address] [phone]     This is a list of the screening recommended for you and due dates:  Health Maintenance  Topic Date Due   COVID-19 Vaccine (4 - 2024-25 season) 09/16/2022   Pap with HPV screening  03/09/2027   DTaP/Tdap/Td vaccine (9 - Td or Tdap) 02/01/2028   Flu Shot  Completed   Hepatitis C Screening  Completed   HIV Screening  Completed   HPV Vaccine  Aged Out     If you have any questions or concerns, please do not hesitate to call the office at [phone].  I look forward to our next visit and until then take care and stay safe.  Regards,   Dana Allan, MD   [facility]

## 2023-01-10 NOTE — Progress Notes (Signed)
SUBJECTIVE:   Chief Complaint  Patient presents with   Breast Mass    Right    HPI Presents to clinic for acute visit  PERTINENT PMH / PSH: As above  OBJECTIVE:  BP 128/70   Pulse 86   Temp 98 F (36.7 C)   Resp 18   Ht 5\' 7"  (1.702 m)   Wt 198 lb 8 oz (90 kg)   LMP 12/16/2022   SpO2 98%   BMI 31.09 kg/m    Physical Exam Vitals reviewed. Exam conducted with a chaperone present.  Constitutional:      Appearance: She is obese.  Chest:     Chest wall: No mass, swelling or tenderness.  Breasts:    Right: Normal. No inverted nipple, mass, nipple discharge, skin change or tenderness.     Left: Normal. No inverted nipple, mass, nipple discharge, skin change or tenderness.  Lymphadenopathy:     Upper Body:     Right upper body: No supraclavicular, axillary or pectoral adenopathy.     Left upper body: No supraclavicular, axillary or pectoral adenopathy.  Neurological:     Mental Status: She is alert.        01/10/2023    9:02 AM 10/29/2022    8:39 AM 03/08/2022   10:21 AM 09/25/2021    8:09 AM 09/06/2021    3:10 PM  Depression screen PHQ 2/9  Decreased Interest 0 0 0 0 0  Down, Depressed, Hopeless 0 1 0 0 0  PHQ - 2 Score 0 1 0 0 0  Altered sleeping 1 0   1  Tired, decreased energy 0 1   1  Change in appetite 2 2   0  Feeling bad or failure about yourself  0 0   0  Trouble concentrating 2 3   3   Moving slowly or fidgety/restless 0 2   0  Suicidal thoughts 0 0   0  PHQ-9 Score 5 9   5   Difficult doing work/chores Somewhat difficult Somewhat difficult   Somewhat difficult      01/10/2023    9:02 AM 10/29/2022    8:40 AM 03/08/2022   10:22 AM 09/06/2021    3:23 PM  GAD 7 : Generalized Anxiety Score  Nervous, Anxious, on Edge 1 2 0 2  Control/stop worrying 1 2 0 3  Worry too much - different things 1 2 0 3  Trouble relaxing 1 2 0 2  Restless 2 2 0 1  Easily annoyed or irritable 2 3 0 3  Afraid - awful might happen 0 0 0 1  Total GAD 7 Score 8 13 0 15   Anxiety Difficulty Somewhat difficult Very difficult Not difficult at all Very difficult    ASSESSMENT/PLAN:  Mass of upper inner quadrant of right breast Assessment & Plan: New palpable mass in the right breast with associated pain and skin changes. No family history of breast cancer. Physical exam did not reveal any palpable masses, but due to patient's symptoms, further imaging is warranted. Suspect fibrocystic breast given was found during recent cycle but cannot rule out malignancy. -Order diagnostic mammogram.  Orders: -     MM 3D DIAGNOSTIC MAMMOGRAM BILATERAL BREAST; Future -     POCT urine pregnancy  Breast pain in female -     MM 3D DIAGNOSTIC MAMMOGRAM BILATERAL BREAST; Future  Birth control counseling Assessment & Plan: Patient is seeking a long-term birth control method with minimal side effects. She has previously used  Nexplanon and oral contraceptives, but had issues with both. She is not interested in IUD or Depo-Provera. Decision on birth control method is deferred until after breast imaging results due to potential estrogen-related risks. -Discuss birth control options after mammogram results. -If patient is interested in IUD or sterilization, refer to OB/GYN.     PDMP reviewed  Return if symptoms worsen or fail to improve, for PCP.  Dana Allan, MD

## 2023-01-22 DIAGNOSIS — F902 Attention-deficit hyperactivity disorder, combined type: Secondary | ICD-10-CM | POA: Diagnosis not present

## 2023-01-22 DIAGNOSIS — F411 Generalized anxiety disorder: Secondary | ICD-10-CM | POA: Diagnosis not present

## 2023-01-29 DIAGNOSIS — F411 Generalized anxiety disorder: Secondary | ICD-10-CM | POA: Diagnosis not present

## 2023-01-29 DIAGNOSIS — F902 Attention-deficit hyperactivity disorder, combined type: Secondary | ICD-10-CM | POA: Diagnosis not present

## 2023-04-23 DIAGNOSIS — F411 Generalized anxiety disorder: Secondary | ICD-10-CM | POA: Diagnosis not present

## 2023-04-23 DIAGNOSIS — F902 Attention-deficit hyperactivity disorder, combined type: Secondary | ICD-10-CM | POA: Diagnosis not present

## 2023-05-09 DIAGNOSIS — N760 Acute vaginitis: Secondary | ICD-10-CM | POA: Diagnosis not present

## 2023-05-09 DIAGNOSIS — N939 Abnormal uterine and vaginal bleeding, unspecified: Secondary | ICD-10-CM | POA: Diagnosis not present

## 2023-05-24 DIAGNOSIS — N840 Polyp of corpus uteri: Secondary | ICD-10-CM | POA: Diagnosis not present

## 2023-05-24 DIAGNOSIS — N939 Abnormal uterine and vaginal bleeding, unspecified: Secondary | ICD-10-CM | POA: Diagnosis not present

## 2023-07-03 DIAGNOSIS — N939 Abnormal uterine and vaginal bleeding, unspecified: Secondary | ICD-10-CM | POA: Diagnosis not present

## 2023-07-03 DIAGNOSIS — N840 Polyp of corpus uteri: Secondary | ICD-10-CM | POA: Diagnosis not present

## 2023-07-09 DIAGNOSIS — F902 Attention-deficit hyperactivity disorder, combined type: Secondary | ICD-10-CM | POA: Diagnosis not present

## 2023-07-09 DIAGNOSIS — F411 Generalized anxiety disorder: Secondary | ICD-10-CM | POA: Diagnosis not present

## 2023-08-13 NOTE — H&P (Signed)
 Kelly Kelly is a 38 y.o. female here for D+C , myosure resection of polyps and Novasure endometrial ablation  Heavy bleeding for the past few yrs worse recently . U/s done last month showed possible endometrial polyp .Husband with a vasectomy .  Svd x 3 History of Present Illness       Past Medical History:  has a past medical history of Anxiety (220), Herpes simplex virus (HSV) infection (2016), and Hyperlipidemia (2022).  Past Surgical History:  has no past surgical history on file. Family History: family history includes Diabetes in her maternal grandmother and maternal uncle; Mental illness in her father; Prostate cancer in her father and paternal uncle; Stroke in her paternal grandmother; Uterine cancer in her maternal grandmother. Social History:  reports that she has never smoked. She has never used smokeless tobacco. She reports that she does not currently use alcohol. She reports that she does not use drugs. OB/GYN History:  OB History       Gravida  4   Para  3   Term  3   Preterm      AB  1   Living  3        SAB      IAB      Ectopic      Molar      Multiple      Live Births  3             Allergies: is allergic to squid. Medications:  Current Medications    Current Outpatient Medications:    dextroamphetamine-amphetamine (ADDERALL) 15 mg tablet, , Disp: , Rfl:    ferrous sulfate  325 (65 FE) MG tablet, Take 325 mg by mouth daily with breakfast, Disp: , Rfl:    sertraline  150 mg Cap, , Disp: , Rfl:      Review of Systems: General:                      No fatigue or weight loss Eyes:                           No vision changes Ears:                            No hearing difficulty Respiratory:                No cough or shortness of breath Pulmonary:                  No asthma or shortness of breath Cardiovascular:           No chest pain, palpitations, dyspnea on exertion Gastrointestinal:          No abdominal bloating, chronic diarrhea,  constipations, masses, pain or hematochezia Genitourinary:             No hematuria, dysuria, abnormal vaginal discharge, pelvic pain, +Menometrorrhagia Lymphatic:                   No swollen lymph nodes Musculoskeletal:No muscle weakness Neurologic:                  No extremity weakness, syncope, seizure disorder Psychiatric:                  No history of depression, delusions or suicidal/homicidal ideation      Exam:  Vitals:    08/14/23 1042  BP: 113/76  Pulse: 93      Body mass index is 33.05 kg/m.   WDWN / black female in NAD   Lungs: CTA  CV : RRR without murmur     Neck:  no thyromegaly Abdomen: soft , no mass, normal active bowel sounds,  non-tender, no rebound tenderness Pelvic: tanner stage 5 ,  External genitalia: vulva /labia no lesions Urethra: no prolapse Vagina: normal physiologic d/c Cervix: no lesions, no cervical motion tenderness   Uterus: normal size shape and contour, non-tender Adnexa: no mass,  non-tender   Rectovaginal:  Pelvic exam done Chaperone present   After discussion she agrees to endometrial biopsy and SIS  Endometrial biopsy:- neg  Uterus ======   Visualized. Size 94 mm x 58 mm x 49 mm Position: anteverted Malformations: none Myometrium: appears normal Endometrium: focal thickening with 2 possible polyps . Endometrial thickness, total 6.1 mm Cervix details: nabothian cysts visualized No fibroids identified Polyp(s)   1.  Broad-based polyp. Fundal. Homogeneous structure. Size 16 mm x 5 mm x 12 mm. Mean 11.0 mm. Doppler: color score 2 (minimal        color)        2.  Broad-based polyp. LUS. Homogeneous structure. Size 10 mm x 4 mm x 11 mm. Mean 8.3 mm. Doppler: color score 2 (minimal color)   Right Ovary =========   Visualized. Size 40 mm x 26 mm x 17 mm   Left Ovary ========   Visualized. Size 38 mm x 16 mm x 16 mm   Cul de Sac =========   Visualized. No free fluid visualized     Impression:    The primary  encounter diagnosis was Endometrial polyp. A diagnosis of Abnormal uterine bleeding (AUB) was also pertinent to this visit.       Plan:  D+C , myosure resection of endometrial polyps and Novasure endometrial ablation     risks discussed - see [facility] notes   Assessment & Plan              Orders Placed This Encounter  Procedures   Pathology Report - [facility]      Endometrial Biopsy      Release to patient:

## 2023-08-21 ENCOUNTER — Encounter
Admission: RE | Admit: 2023-08-21 | Discharge: 2023-08-21 | Disposition: A | Discharge status: Home / Self Care | Source: Ambulatory Visit | Attending: Obstetrics and Gynecology | Admitting: Obstetrics and Gynecology

## 2023-08-21 ENCOUNTER — Other Ambulatory Visit: Payer: Self-pay

## 2023-08-21 HISTORY — DX: Depression, unspecified: F32.A

## 2023-08-21 NOTE — Patient Instructions (Addendum)
 Your procedure is scheduled nw:UYLMDIJB AUGUST 14  Report to the Registration Desk on the 1st floor of the [facility] To find out your arrival time, please call [phone] between 1PM - 3PM on:  [facility] AUGUST 13  If your arrival time is 6:00 am, do not arrive before that time as the [facility] entrance doors do not open until 6:00 am.  REMEMBER: Instructions that are not followed completely may result in serious medical risk, up to and including death; or upon the discretion of your surgeon and anesthesiologist your surgery may need to be rescheduled.  Do not eat food after midnight the night before surgery.  No gum chewing or hard candies.  You may however, drink CLEAR liquids up to 2 hours before you are scheduled to arrive for your surgery. Do not drink anything within 2 hours of your scheduled arrival time.  Clear liquids include: - water  - apple juice without pulp - gatorade (not RED colors) - black coffee or tea (Do NOT add milk or creamers to the coffee or tea) Do NOT drink anything that is not on this list.  One week prior to surgery:  THURSDAY AUGUST 7  Stop Anti-inflammatories (NSAIDS) such as Advil , Aleve, Ibuprofen , Motrin , Naproxen, Naprosyn and Aspirin based products such as Excedrin, Goody's Powder, BC Powder. Stop ANY OVER THE COUNTER supplements until after surgery. ferrous sulfate   Vitamin D , Ergocalciferol , (DRISDOL )  Womens Probiotic   You may however, continue to take Tylenol  if needed for pain up until the day of surgery.  Continue taking all of your other prescription medications up until the day of surgery.  ON THE DAY OF SURGERY ONLY TAKE THESE MEDICATIONS WITH SIPS OF WATER:  sertraline  (ZOLOFT )  amphetamine-dextroamphetamine (ADDERALL XR)   No Alcohol for 24 hours before or after surgery.  Do not use any recreational drugs for at least a week (preferably 2 weeks) before your surgery.  Please be advised that the combination of cocaine  and anesthesia may have negative outcomes, up to and including death. If you test positive for cocaine, your surgery will be cancelled.  On the morning of surgery brush your teeth with toothpaste and water, you may rinse your mouth with mouthwash if you wish. Do not swallow any toothpaste or mouthwash.  Do not wear jewelry, make-up, hairpins, clips or nail polish.  For welded (permanent) jewelry: bracelets, anklets, waist bands, etc.  Please have this removed prior to surgery.  If it is not removed, there is a chance that hospital personnel will need to cut it off on the day of surgery.  Do not wear lotions, powders, or perfumes.   Do not shave body hair from the neck down 48 hours before surgery.  Contact lenses, hearing aids and dentures may not be worn into surgery.  Do not bring valuables to the hospital. [facility] is not responsible for any missing/lost belongings or valuables.   Notify your doctor if there is any change in your medical condition (cold, fever, infection).  Wear comfortable clothing (specific to your surgery type) to the hospital.  After surgery, you can help prevent lung complications by doing breathing exercises.  Take deep breaths and cough every 1-2 hours. Your doctor may order a device called an Incentive Spirometer to help you take deep breaths.  If you are being discharged the day of surgery, you will not be allowed to drive home. You will need a responsible individual to drive you home and stay with you for  24 hours after surgery.   If you are taking public transportation, you will need to have a responsible individual with you.  Please call the Pre-admissions Testing Dept. at [phone] if you have any questions about these instructions.  Surgery Visitation Policy:  Patients having surgery or a procedure may have two visitors.  Children under the age of 92 must have an adult with them who is not the patient.  Merchandiser, retail to  address health-related social needs:  [email or web address]://[employer].Proor.no    How to Use an Incentive Spirometer  An incentive spirometer is a tool that measures how well you are filling your lungs with each breath. Learning to take long, deep breaths using this tool can help you keep your lungs clear and active. This may help to reverse or lessen your chance of developing breathing (pulmonary) problems, especially infection. You may be asked to use a spirometer: After a surgery. If you have a lung problem or a history of smoking. After a long period of time when you have been unable to move or be active. If the spirometer includes an indicator to show the highest number that you have reached, your health care provider or respiratory therapist will help you set a goal. Keep a log of your progress as told by your health care provider. What are the risks? Breathing too quickly may cause dizziness or cause you to pass out. Take your time so you do not get dizzy or light-headed. If you are in pain, you may need to take pain medicine before doing incentive spirometry. It is harder to take a deep breath if you are having pain. How to use your incentive spirometer  Sit up on the edge of your bed or on a chair. Hold the incentive spirometer so that it is in an upright position. Before you use the spirometer, breathe out normally. Place the mouthpiece in your mouth. Make sure your lips are closed tightly around it. Breathe in slowly and as deeply as you can through your mouth, causing the piston or the ball to rise toward the top of the chamber. Hold your breath for 3-5 seconds, or for as long as possible. If the spirometer includes a coach indicator, use this to guide you in breathing. Slow down your breathing if the indicator goes above the marked areas. Remove the mouthpiece from your mouth and breathe out normally. The piston or ball will return to the bottom of the chamber. Rest for a few seconds,  then repeat the steps 10 or more times. Take your time and take a few normal breaths between deep breaths so that you do not get dizzy or light-headed. Do this every 1-2 hours when you are awake. If the spirometer includes a goal marker to show the highest number you have reached (best effort), use this as a goal to work toward during each repetition. After each set of 10 deep breaths, cough a few times. This will help to make sure that your lungs are clear. If you have an incision on your chest or abdomen from surgery, place a pillow or a rolled-up towel firmly against the incision when you cough. This can help to reduce pain while taking deep breaths and coughing. General tips When you are able to get out of bed: Walk around often. Continue to take deep breaths and cough in order to clear your lungs. Keep using the incentive spirometer until your health care provider says it is okay to stop using it. If  you have been in the hospital, you may be told to keep using the spirometer at home. Contact a health care provider if: You are having difficulty using the spirometer. You have trouble using the spirometer as often as instructed. Your pain medicine is not giving enough relief for you to use the spirometer as told. You have a fever. Get help right away if: You develop shortness of breath. You develop a cough with bloody mucus from the lungs. You have fluid or blood coming from an incision site after you cough. Summary An incentive spirometer is a tool that can help you learn to take long, deep breaths to keep your lungs clear and active. You may be asked to use a spirometer after a surgery, if you have a lung problem or a history of smoking, or if you have been inactive for a long period of time. Use your incentive spirometer as instructed every 1-2 hours while you are awake. If you have an incision on your chest or abdomen, place a pillow or a rolled-up towel firmly against your incision  when you cough. This will help to reduce pain. Get help right away if you have shortness of breath, you cough up bloody mucus, or blood comes from your incision when you cough. This information is not intended to replace advice given to you by your health care provider. Make sure you discuss any questions you have with your health care provider. Document Revised: 03/23/2019 Document Reviewed: 03/23/2019 Elsevier Patient Education  2023 [employer].

## 2023-08-22 ENCOUNTER — Encounter
Admission: RE | Admit: 2023-08-22 | Discharge: 2023-08-22 | Disposition: A | Discharge status: Home / Self Care | Source: Ambulatory Visit | Attending: Obstetrics and Gynecology | Admitting: Obstetrics and Gynecology

## 2023-08-22 DIAGNOSIS — Z01812 Encounter for preprocedural laboratory examination: Secondary | ICD-10-CM | POA: Diagnosis not present

## 2023-08-22 DIAGNOSIS — Z01818 Encounter for other preprocedural examination: Secondary | ICD-10-CM

## 2023-08-22 LAB — BASIC METABOLIC PANEL WITH GFR
Anion gap: 5 (ref 5–15)
BUN: 13 mg/dL (ref 6–20)
CO2: 28 mmol/L (ref 22–32)
Calcium: 8.7 mg/dL — ABNORMAL LOW (ref 8.9–10.3)
Chloride: 105 mmol/L (ref 98–111)
Creatinine, Ser: 0.8 mg/dL (ref 0.44–1.00)
GFR, Estimated: >60 mL/min (ref >60)
Glucose, Bld: 89 mg/dL (ref 70–99)
Potassium: 3.8 mmol/L (ref 3.5–5.1)
Sodium: 138 mmol/L (ref 135–145)

## 2023-08-22 LAB — TYPE AND SCREEN
ABO/RH(D): A POS
Antibody Screen: NEGATIVE

## 2023-08-22 LAB — CBC
HCT: 36.4 % (ref 36.0–46.0)
Hemoglobin: 11.8 g/dL — ABNORMAL LOW (ref 12.0–15.0)
MCH: 28.9 pg (ref 26.0–34.0)
MCHC: 32.4 g/dL (ref 30.0–36.0)
MCV: 89 fL (ref 80.0–100.0)
Platelets: 370 K/uL (ref 150–400)
RBC: 4.09 MIL/uL (ref 3.87–5.11)
RDW: 14.4 % (ref 11.5–15.5)
WBC: 7 K/uL (ref 4.0–10.5)
nRBC: 0 % (ref 0.0–0.2)

## 2023-08-28 MED ORDER — LACTATED RINGERS IV SOLN: INTRAVENOUS | Status: DC

## 2023-08-28 MED ORDER — ACETAMINOPHEN 500 MG PO TABS
1000.0000 mg | ORAL_TABLET | ORAL | Status: AC
  Administered 2023-08-29: 1000 mg via ORAL

## 2023-08-28 MED ORDER — ORAL CARE MOUTH RINSE: 15.0000 mL | Freq: Once | OROMUCOSAL | Status: AC

## 2023-08-28 MED ORDER — CEFAZOLIN SODIUM-DEXTROSE 2-4 GM/100ML-% IV SOLN
2.0000 g | Freq: Once | INTRAVENOUS | Status: AC
  Administered 2023-08-29: 2 g via INTRAVENOUS

## 2023-08-28 MED ORDER — POVIDONE-IODINE 10 % EX SWAB: 2.0000 | Freq: Once | CUTANEOUS | Status: DC

## 2023-08-28 MED ORDER — CHLORHEXIDINE GLUCONATE 0.12 % MT SOLN
15.0000 mL | Freq: Once | OROMUCOSAL | Status: AC
  Administered 2023-08-29: 15 mL via OROMUCOSAL

## 2023-08-29 ENCOUNTER — Ambulatory Visit: Payer: Self-pay | Admitting: Urgent Care

## 2023-08-29 ENCOUNTER — Encounter: Payer: Self-pay | Admitting: Obstetrics and Gynecology

## 2023-08-29 ENCOUNTER — Ambulatory Visit: Payer: Self-pay | Admitting: Anesthesiology

## 2023-08-29 ENCOUNTER — Other Ambulatory Visit: Payer: Self-pay

## 2023-08-29 ENCOUNTER — Encounter
Admission: RE | Disposition: A | Discharge status: Home / Self Care | Payer: Self-pay | Source: Home / Self Care | Attending: Obstetrics and Gynecology

## 2023-08-29 ENCOUNTER — Ambulatory Visit
Admission: RE | Admit: 2023-08-29 | Discharge: 2023-08-29 | Disposition: A | Discharge status: Home / Self Care | Attending: Obstetrics and Gynecology | Admitting: Obstetrics and Gynecology

## 2023-08-29 DIAGNOSIS — F32A Depression, unspecified: Secondary | ICD-10-CM | POA: Insufficient documentation

## 2023-08-29 DIAGNOSIS — N840 Polyp of corpus uteri: Secondary | ICD-10-CM | POA: Insufficient documentation

## 2023-08-29 DIAGNOSIS — F419 Anxiety disorder, unspecified: Secondary | ICD-10-CM | POA: Diagnosis not present

## 2023-08-29 DIAGNOSIS — N939 Abnormal uterine and vaginal bleeding, unspecified: Secondary | ICD-10-CM | POA: Insufficient documentation

## 2023-08-29 DIAGNOSIS — Z6828 Body mass index (BMI) 28.0-28.9, adult: Secondary | ICD-10-CM | POA: Diagnosis not present

## 2023-08-29 DIAGNOSIS — N921 Excessive and frequent menstruation with irregular cycle: Secondary | ICD-10-CM | POA: Diagnosis not present

## 2023-08-29 DIAGNOSIS — Z01818 Encounter for other preprocedural examination: Secondary | ICD-10-CM

## 2023-08-29 HISTORY — PX: HYSTEROSCOPY: SHX211

## 2023-08-29 HISTORY — PX: MYOSURE RESECTION: SHX7611

## 2023-08-29 HISTORY — PX: DILATION AND CURETTAGE OF UTERUS: SHX78

## 2023-08-29 LAB — POCT PREGNANCY, URINE: Preg Test, Ur: NEGATIVE

## 2023-08-29 SURGERY — ABLATION, ENDOMETRIUM, HYSTEROSCOPIC
Anesthesia: General | Site: Cervix

## 2023-08-29 MED ORDER — LIDOCAINE HCL (PF) 2 % IJ SOLN
INTRAMUSCULAR | Status: AC
Start: 2023-08-29 — End: 2023-08-29
  Filled 2023-08-29: qty 5

## 2023-08-29 MED ORDER — OXYCODONE HCL 5 MG PO TABS
ORAL_TABLET | ORAL | Status: AC
  Filled 2023-08-29: qty 1

## 2023-08-29 MED ORDER — ACETAMINOPHEN 500 MG PO TABS
ORAL_TABLET | ORAL | Status: AC
  Filled 2023-08-29: qty 2

## 2023-08-29 MED ORDER — LIDOCAINE HCL (CARDIAC) PF 100 MG/5ML IV SOSY
PREFILLED_SYRINGE | INTRAVENOUS | Status: DC | PRN
  Administered 2023-08-29: 100 mg via INTRAVENOUS

## 2023-08-29 MED ORDER — CHLORHEXIDINE GLUCONATE 0.12 % MT SOLN
OROMUCOSAL | Status: AC
  Filled 2023-08-29: qty 15

## 2023-08-29 MED ORDER — DEXMEDETOMIDINE HCL IN NACL 80 MCG/20ML IV SOLN
INTRAVENOUS | Status: AC
  Filled 2023-08-29: qty 20

## 2023-08-29 MED ORDER — PROPOFOL 10 MG/ML IV BOLUS
INTRAVENOUS | Status: AC
  Filled 2023-08-29: qty 20

## 2023-08-29 MED ORDER — EPHEDRINE 5 MG/ML INJ
INTRAVENOUS | Status: AC
  Filled 2023-08-29: qty 5

## 2023-08-29 MED ORDER — KETOROLAC TROMETHAMINE 30 MG/ML IJ SOLN
INTRAMUSCULAR | Status: DC | PRN
  Administered 2023-08-29: 15 mg via INTRAVENOUS

## 2023-08-29 MED ORDER — EPHEDRINE SULFATE-NACL 50-0.9 MG/10ML-% IV SOSY
PREFILLED_SYRINGE | INTRAVENOUS | Status: DC | PRN
  Administered 2023-08-29: 5 mg via INTRAVENOUS

## 2023-08-29 MED ORDER — FENTANYL CITRATE (PF) 100 MCG/2ML IJ SOLN: 25.0000 ug | INTRAMUSCULAR | Status: DC | PRN

## 2023-08-29 MED ORDER — OXYCODONE HCL 5 MG PO TABS
5.0000 mg | ORAL_TABLET | Freq: Once | ORAL | Status: AC | PRN
  Administered 2023-08-29: 5 mg via ORAL

## 2023-08-29 MED ORDER — ONDANSETRON HCL 4 MG/2ML IJ SOLN
INTRAMUSCULAR | Status: DC | PRN
  Administered 2023-08-29: 4 mg via INTRAVENOUS

## 2023-08-29 MED ORDER — ACETAMINOPHEN 10 MG/ML IV SOLN: 1000.0000 mg | Freq: Once | INTRAVENOUS | Status: DC | PRN

## 2023-08-29 MED ORDER — MIDAZOLAM HCL 2 MG/2ML IJ SOLN
INTRAMUSCULAR | Status: DC | PRN
  Administered 2023-08-29: 2 mg via INTRAVENOUS

## 2023-08-29 MED ORDER — 0.9 % SODIUM CHLORIDE (POUR BTL) OPTIME
TOPICAL | Status: DC | PRN
  Administered 2023-08-29: 500 mL

## 2023-08-29 MED ORDER — FENTANYL CITRATE (PF) 100 MCG/2ML IJ SOLN
INTRAMUSCULAR | Status: DC | PRN
Start: 2023-08-29 — End: 2023-08-29
  Administered 2023-08-29: 25 ug via INTRAVENOUS
  Administered 2023-08-29: 50 ug via INTRAVENOUS

## 2023-08-29 MED ORDER — MIDAZOLAM HCL 2 MG/2ML IJ SOLN
INTRAMUSCULAR | Status: AC
  Filled 2023-08-29: qty 2

## 2023-08-29 MED ORDER — CEFAZOLIN SODIUM-DEXTROSE 2-4 GM/100ML-% IV SOLN
INTRAVENOUS | Status: AC
  Filled 2023-08-29: qty 100

## 2023-08-29 MED ORDER — LACTATED RINGERS IV SOLN
INTRAVENOUS | Status: DC | PRN
Start: 2023-08-29 — End: 2023-08-29

## 2023-08-29 MED ORDER — DROPERIDOL 2.5 MG/ML IJ SOLN: 0.6250 mg | Freq: Once | INTRAMUSCULAR | Status: DC | PRN

## 2023-08-29 MED ORDER — SILVER NITRATE-POT NITRATE 75-25 % EX MISC
CUTANEOUS | Status: DC | PRN
  Administered 2023-08-29: 4

## 2023-08-29 MED ORDER — FENTANYL CITRATE (PF) 100 MCG/2ML IJ SOLN
INTRAMUSCULAR | Status: AC
  Filled 2023-08-29: qty 2

## 2023-08-29 MED ORDER — OXYCODONE HCL 5 MG/5ML PO SOLN: 5.0000 mg | Freq: Once | ORAL | Status: AC | PRN

## 2023-08-29 MED ORDER — KETOROLAC TROMETHAMINE 30 MG/ML IJ SOLN
INTRAMUSCULAR | Status: AC
  Filled 2023-08-29: qty 1

## 2023-08-29 MED ORDER — DEXAMETHASONE SODIUM PHOSPHATE 10 MG/ML IJ SOLN
INTRAMUSCULAR | Status: AC
  Filled 2023-08-29: qty 1

## 2023-08-29 MED ORDER — DEXMEDETOMIDINE HCL IN NACL 80 MCG/20ML IV SOLN
INTRAVENOUS | Status: DC | PRN
Start: 2023-08-29 — End: 2023-08-29
  Administered 2023-08-29 (×2): 4 ug via INTRAVENOUS

## 2023-08-29 MED ORDER — PROPOFOL 10 MG/ML IV BOLUS
INTRAVENOUS | Status: DC | PRN
  Administered 2023-08-29: 200 mg via INTRAVENOUS

## 2023-08-29 MED ORDER — DEXAMETHASONE SODIUM PHOSPHATE 10 MG/ML IJ SOLN
INTRAMUSCULAR | Status: DC | PRN
  Administered 2023-08-29: 10 mg via INTRAVENOUS

## 2023-08-29 SURGICAL SUPPLY — 21 items
ABLATOR SURESOUND NOVASURE (ABLATOR) IMPLANT
BAG PRESSURE INF REUSE 1000 (BAG) ×1 IMPLANT
DEVICE MYOSURE LITE (MISCELLANEOUS) IMPLANT
DEVICE MYOSURE REACH (MISCELLANEOUS) IMPLANT
DRSG TELFA 3X8 NADH STRL (GAUZE/BANDAGES/DRESSINGS) ×1 IMPLANT
GAUZE PETROLATUM 1 X8 (GAUZE/BANDAGES/DRESSINGS) IMPLANT
GLOVE SURG SYN 8.0 PF PI (GLOVE) ×1 IMPLANT
GOWN STRL REUS W/ TWL LRG LVL3 (GOWN DISPOSABLE) ×1 IMPLANT
GOWN STRL REUS W/ TWL XL LVL3 (GOWN DISPOSABLE) ×1 IMPLANT
IV NS 1000ML BAXH (IV SOLUTION) ×1 IMPLANT
KIT PROCEDURE FLUENT (KITS) IMPLANT
KIT TURNOVER CYSTO (KITS) ×1 IMPLANT
MANIFOLD NEPTUNE II (INSTRUMENTS) ×1 IMPLANT
PACK DNC HYST (MISCELLANEOUS) IMPLANT
SEAL ROD LENS SCOPE MYOSURE (ABLATOR) ×1 IMPLANT
SET CYSTO W/LG BORE CLAMP LF (SET/KITS/TRAYS/PACK) IMPLANT
SOLUTION PREP PVP 2OZ (MISCELLANEOUS) ×1 IMPLANT
SUCTION TUBE FRAZIER 10FR DISP (SUCTIONS) IMPLANT
TOWEL OR 17X26 4PK STRL BLUE (TOWEL DISPOSABLE) ×1 IMPLANT
TRAP FLUID SMOKE EVACUATOR (MISCELLANEOUS) ×1 IMPLANT
TUBING CONNECTING 10 (TUBING) IMPLANT

## 2023-08-29 NOTE — Transfer of Care (Signed)
 Immediate Anesthesia Transfer of Care Note  Patient: Kelly Kelly  Procedure(s) Performed: ABLATION, ENDOMETRIUM, HYSTEROSCOPIC (Cervix) DILATION AND CURETTAGE (Cervix) MYOSURE RESECTION (Cervix)  Patient Location: PACU  Anesthesia Type:General  Level of Consciousness: drowsy  Airway & Oxygen Therapy: Patient Spontanous Breathing and Patient connected to face mask oxygen  Post-op Assessment: Report given to RN and Post -op Vital signs reviewed and stable  Post vital signs: Reviewed and stable  Last Vitals:  Vitals Value Taken Time  BP 107/65 08/29/23 09:15  Temp 37.1 C 08/29/23 09:13  Pulse 63 08/29/23 09:16  Resp 12 08/29/23 09:16  SpO2 98 % 08/29/23 09:16  Vitals shown include unfiled device data.  Last Pain:  Vitals:   08/29/23 0913  TempSrc:   PainSc: Asleep         Complications: No notable events documented.

## 2023-08-29 NOTE — Anesthesia Procedure Notes (Signed)
 Procedure Name: LMA Insertion Date/Time: 08/29/2023 7:48 AM  Performed by: Dyane Mass, CRNAPre-anesthesia Checklist: Patient identified, Emergency Drugs available, Suction available and Patient being monitored Patient Re-evaluated:Patient Re-evaluated prior to induction Oxygen Delivery Method: Circle system utilized Preoxygenation: Pre-oxygenation with 100% oxygen Induction Type: IV induction Ventilation: Mask ventilation without difficulty LMA: LMA inserted LMA Size: 4.0 Tube type: Oral Number of attempts: 1 Placement Confirmation: positive ETCO2 and breath sounds checked- equal and bilateral Tube secured with: Tape Dental Injury: Teeth and Oropharynx as per pre-operative assessment

## 2023-08-29 NOTE — Progress Notes (Signed)
 Pt here for d+ c , myosure resection of polyps and novasure endometrial ablation . HCG neg ,, all questions answered . Proceed

## 2023-08-29 NOTE — Anesthesia Preprocedure Evaluation (Signed)
 Anesthesia Evaluation  Patient identified by MRN, date of birth, ID band Patient awake    Reviewed: Allergy & Precautions, H&P , NPO status , Patient's Chart, lab work & pertinent test results, reviewed documented beta blocker date and time   Airway Mallampati: II  TM Distance: >3 FB Neck ROM: full    Dental  (+) Teeth Intact   Pulmonary neg pulmonary ROS   Pulmonary exam normal        Cardiovascular Exercise Tolerance: Good negative cardio ROS Normal cardiovascular exam Rate:Normal     Neuro/Psych  PSYCHIATRIC DISORDERS Anxiety Depression    negative neurological ROS     GI/Hepatic negative GI ROS, Neg liver ROS,,,  Endo/Other  negative endocrine ROS    Renal/GU negative Renal ROS  negative genitourinary   Musculoskeletal   Abdominal   Peds  Hematology negative hematology ROS (+)   Anesthesia Other Findings   Reproductive/Obstetrics negative OB ROS                              Anesthesia Physical Anesthesia Plan  ASA: 3  Anesthesia Plan: General LMA   Post-op Pain Management:    Induction:   PONV Risk Score and Plan: 4 or greater  Airway Management Planned:   Additional Equipment:   Intra-op Plan:   Post-operative Plan:   Informed Consent: I have reviewed the patients History and Physical, chart, labs and discussed the procedure including the risks, benefits and alternatives for the proposed anesthesia with the patient or authorized representative who has indicated his/her understanding and acceptance.       Plan Discussed with: CRNA  Anesthesia Plan Comments:         Anesthesia Quick Evaluation

## 2023-08-29 NOTE — Brief Op Note (Signed)
 08/29/2023  9:04 AM  PATIENT:  Kelly Kelly  38 y.o. female  PRE-OPERATIVE DIAGNOSIS:  menorrhagia  POST-OPERATIVE DIAGNOSIS:  menorrhagia  PROCEDURE:  Procedure(s) with comments: ABLATION, ENDOMETRIUM, HYSTEROSCOPIC (N/A) - NOVASURE DILATION AND CURETTAGE (N/A) MYOSURE RESECTION (N/A) - ENDOMETRIAL POLYP  SURGEON:  Surgeons and Role:    * Shaira Sova, Debby PARAS, MD - Primary  PHYSICIAN ASSISTANT: PA student [place]  ASSISTANTS: none   ANESTHESIA:   general  EBL:  10 cc  IOF 400 cc uo 130 cc    BLOOD ADMINISTERED:none  DRAINS: none   LOCAL MEDICATIONS USED:  OTHER toradol  SPECIMEN:  Source of Specimen:  ecc , emdometrial curretings and endometrial polyp   DISPOSITION OF SPECIMEN:  PATHOLOGY  COUNTS:  YES  TOURNIQUET:  * No tourniquets in log *  DICTATION: .Other Dictation: Dictation Number verbal  PLAN OF CARE: Discharge to home after PACU  PATIENT DISPOSITION:  PACU - hemodynamically stable.   Delay start of Pharmacological VTE agent (>24hrs) due to surgical blood loss or risk of bleeding: not applicable

## 2023-08-30 ENCOUNTER — Encounter: Payer: Self-pay | Admitting: Obstetrics and Gynecology

## 2023-08-30 LAB — SURGICAL PATHOLOGY

## 2023-08-30 NOTE — Op Note (Unsigned)
 NAMELUBERTHA, Kelly Kelly MEDICAL RECORD NO: [id number] ACCOUNT NO: [id number] DATE OF BIRTH: Apr 02, 1985 FACILITY: [facility] LOCATION: [facility] PHYSICIAN: Debby DOROTHA Dinsmore, MD  Operative Report   DATE OF PROCEDURE: 08/29/2023  PREOPERATIVE DIAGNOSES: 1.  Abnormal uterine bleeding. 2.  Endometrial polyp.  POSTOPERATIVE DIAGNOSES: 1.  Abnormal uterine bleeding. 2.  Endometrial polyp.  PROCEDURES PERFORMED: 1.  Fractional dilation and curettage. 2.  Hysteroscopic resection of endometrial polyp with MyoSure. 3.  NovaSure endometrial ablation.  SURGEON:  Debby DOROTHA Dinsmore, MD  FIRST ASSISTANT:  PA student, Manny.  ANESTHESIA:  General endotracheal anesthesia.  INDICATIONS:  The patient is a 38 year old G3 P3 patient with a long history of abnormal bleeding, heavy menorrhagia.  The patient was noted to have an endometrial polyp on the saline infusion sonohysterography.  Endometrial biopsy negative.  DESCRIPTION OF PROCEDURE:  After adequate general endotracheal anesthesia, the patient was placed in the dorsal supine position with the legs in candy cane stirrups.  The patient's perineum and vagina were prepped and draped in normal sterile fashion.   The patient received 2 g of IV Ancef prior to the commencement of the case.  Timeout was performed.  Weighted speculum was placed in the posterior vaginal vault and the anterior cervix was grasped with a single-tooth tenaculum.  An endocervical curettage  was then performed followed by uterine sounding.  Uterus sounded to 8 cm.  Cervical length estimated 4 cm.  The 5-mm hysteroscope was brought up to the operative field and advanced into the endometrial cavity.  A pedunculated fibroid was noted at the 7  o'clock position close to the uterine cervical junction.  The MyoSure light was then brought up to the operative field, advanced, and the polyp was dissected.  The hysteroscope was removed and a gentle endometrial curettage was  performed with additional  tissue that will be sent with the endometrial polyp.  Of note, the cervix was dilated with a #15 Hanks dilator prior to advancement of the hysteroscope.  The endometrial NovaSure ablator was then brought up to the operative field and was advanced into  the endometrial cavity.  Endometrial length estimated at 4 cm.  The array was opened and the width was measured at 3.8 cm. Petrolueum gauze  was then used to close the patulous cervix and the cavity assessment test was then performed and passed.  The wattage setting  at 84 watts.  The ablation took place for 58 seconds.  The ablator was then removed and a repeat hysteroscopy demonstrated normal charring effect of the endometrial cavity.  Silver nitrate was used on the tenaculum site.  Good hemostasis was noted.  The  patient did receive 30 mg of intravenous Toradol at the end of the procedure.  There were no complications.  ESTIMATED BLOOD LOSS:  10 mL.  INTRAOPERATIVE FLUIDS:  400 mL.  URINE OUTPUT:  130 mL.  Performed prior to performing the endocervical curettage 130 mL.  Straight catheter was removed.  DISPOSITION:  The patient was taken to the recovery room in good condition.    MUK D: 08/29/2023 9:30:57 am T: 08/30/2023 1:13:00 am  JOB: [id number]/ [id number]

## 2023-09-11 NOTE — Anesthesia Postprocedure Evaluation (Signed)
 Anesthesia Post Note  Patient: Associate Professor  Procedure(s) Performed: ABLATION, ENDOMETRIUM, HYSTEROSCOPIC (Cervix) DILATION AND CURETTAGE (Cervix) MYOSURE RESECTION (Cervix)  Patient location during evaluation: PACU Anesthesia Type: General Level of consciousness: awake and alert Pain management: pain level controlled Vital Signs Assessment: post-procedure vital signs reviewed and stable Respiratory status: spontaneous breathing, nonlabored ventilation, respiratory function stable and patient connected to nasal cannula oxygen Cardiovascular status: blood pressure returned to baseline and stable Postop Assessment: no apparent nausea or vomiting Anesthetic complications: no   No notable events documented.   Last Vitals:  Vitals:   08/29/23 0945 08/29/23 1005  BP: 132/74 (!) 146/78  Pulse: 82 74  Resp: 15 16  Temp: 36.5 C 36.5 C  SpO2: 100% 99%    Last Pain:  Vitals:   08/30/23 0926  TempSrc:   PainSc: 2                  Lynwood KANDICE Clause

## 2023-10-08 DIAGNOSIS — F902 Attention-deficit hyperactivity disorder, combined type: Secondary | ICD-10-CM | POA: Diagnosis not present

## 2023-10-08 DIAGNOSIS — F411 Generalized anxiety disorder: Secondary | ICD-10-CM | POA: Diagnosis not present

## 2023-12-11 ENCOUNTER — Telehealth: Admitting: Emergency Medicine

## 2023-12-11 DIAGNOSIS — J029 Acute pharyngitis, unspecified: Secondary | ICD-10-CM | POA: Diagnosis not present

## 2023-12-11 MED ORDER — PREDNISONE 10 MG (21) PO TBPK
ORAL_TABLET | ORAL | 0 refills | Status: AC
Start: 2023-12-11 — End: ?

## 2023-12-11 MED ORDER — LIDOCAINE VISCOUS HCL 2 % MT SOLN: 15.0000 mL | OROMUCOSAL | 0 refills | Status: AC | PRN

## 2023-12-11 NOTE — Progress Notes (Signed)
 Virtual Visit Consent   Kelly Kelly, you are scheduled for a virtual visit with a [facility] provider today. Just as with appointments in the office, your consent must be obtained to participate. Your consent will be active for this visit and any virtual visit you may have with one of our providers in the next 365 days. If you have a MyChart account, a copy of this consent can be sent to you electronically.  As this is a virtual visit, video technology does not allow for your provider to perform a traditional examination. This may limit your provider's ability to fully assess your condition. If your provider identifies any concerns that need to be evaluated in person or the need to arrange testing (such as labs, EKG, etc.), we will make arrangements to do so. Although advances in technology are sophisticated, we cannot ensure that it will always work on either your end or our end. If the connection with a video visit is poor, the visit may have to be switched to a telephone visit. With either a video or telephone visit, we are not always able to ensure that we have a secure connection.  By engaging in this virtual visit, you consent to the provision of healthcare and authorize for your insurance to be billed (if applicable) for the services provided during this visit. Depending on your insurance coverage, you may receive a charge related to this service.  I need to obtain your verbal consent now. Are you willing to proceed with your visit today? Asjah Rauda has provided verbal consent on 12/11/2023 for a virtual visit (video or telephone). Jon CHRISTELLA Belt, NP  Date: 12/11/2023 10:56 AM   Virtual Visit via Video Note   I, Jon CHRISTELLA Belt, connected with  Nikitha Mode  ([id number], [birth year]) on 12/11/23 at 10:45 AM EST by a video-enabled telemedicine application and verified that I am speaking with the correct person using two identifiers.  Location: Patient: Virtual Visit  Location Patient: Home Provider: Virtual Visit Location Provider: Home Office   I discussed the limitations of evaluation and management by telemedicine and the availability of in person appointments. The patient expressed understanding and agreed to proceed.    History of Present Illness: Kelly Kelly is a 38 y.o. who identifies as a female who was assigned female at birth, and is being seen today for severe sore throat.   Hard to swallow, talk. Sore throat x 3 days; started at night only, then last night and today is constant. Ears also hurt at times, more on the left ear than on the right, they don't hurt right now.   No swollen or painful glands. No coughing, not clearing throat. No fever.   Started Aleve sinus yesterday, helped slightly yesterday.   Looked in her throat. Sounds like soft palate is very red, she did not see enlarged tonsils.   No strep contacts taht she knows of  HPI: HPI  Problems:  Patient Active Problem List   Diagnosis Date Noted   Mass of upper inner quadrant of right breast 01/10/2023   Breast pain in female 01/10/2023   Birth control counseling 01/10/2023   Menorrhagia 10/29/2022   Abnormal uterine bleeding (AUB) 10/29/2022   Neutropenia 10/29/2022   Abnormal glucose 10/29/2022   Need for influenza vaccination 10/29/2022   Cervical cancer screening 03/11/2022   Lipid screening 03/11/2022   Thyroid  disorder screening 03/11/2022   Encounter for hepatitis C screening test for low risk patient 03/11/2022   Healthcare maintenance 03/11/2022  Class 1 obesity 09/15/2021   Depression 06/28/2021   Insulin  resistance 05/11/2021   Elevated LDL cholesterol level 05/11/2021   Vitamin D  deficiency 05/11/2021   Chronic constipation 04/26/2021   Body mass index 28.0-28.9, adult 07/17/2019   Anxiety 07/17/2019    Allergies: No Known Allergies Medications:  Current Outpatient Medications:    lidocaine  (XYLOCAINE ) 2 % solution, Use as directed 15 mLs in  the mouth or throat as needed for mouth pain., Disp: 100 mL, Rfl: 0   predniSONE  (STERAPRED UNI-PAK 21 TAB) 10 MG (21) TBPK tablet, Take 6 tabs on day 1; take 5 tabs day 2; take 4 tabs day 3; take 3 tabs day 4; take 2 tabs day 5; take 1 tab day 6, Disp: 21 tablet, Rfl: 0   amphetamine-dextroamphetamine (ADDERALL XR) 15 MG 24 hr capsule, Take 15 mg by mouth every morning., Disp: , Rfl:    ferrous sulfate  325 (65 FE) MG tablet, Take 325 mg by mouth daily with breakfast., Disp: , Rfl:    OVER THE COUNTER MEDICATION, Use as directed 1 tablet in the mouth or throat daily. Womens Probiotic, Disp: , Rfl:    sertraline  (ZOLOFT ) 50 MG tablet, Take 1.5 tablets (75 mg total) by mouth daily., Disp: 45 tablet, Rfl: 0   Vitamin D , Ergocalciferol , (DRISDOL ) 1.25 MG (50000 UNIT) CAPS capsule, Take 1 capsule (50,000 Units total) by mouth every 7 (seven) days., Disp: 4 capsule, Rfl: 0  Observations/Objective: Patient is well-developed, well-nourished in no acute distress.  Resting comfortably  at home.  Head is normocephalic, atraumatic.  No labored breathing.  Speech is clear and coherent with logical content.  Patient is alert and oriented at baseline.    Assessment and Plan: 1. Viral pharyngitis (Primary) - predniSONE  (STERAPRED UNI-PAK 21 TAB) 10 MG (21) TBPK tablet; Take 6 tabs on day 1; take 5 tabs day 2; take 4 tabs day 3; take 3 tabs day 4; take 2 tabs day 5; take 1 tab day 6  Dispense: 21 tablet; Refill: 0 - lidocaine  (XYLOCAINE ) 2 % solution; Use as directed 15 mLs in the mouth or throat as needed for mouth pain.  Dispense: 100 mL; Refill: 0  Ok to continue aleve sinus medicine. Given intermittent ear pain, she may have some head congestion causing ear and throat pain.   Follow Up Instructions: I discussed the assessment and treatment plan with the patient. The patient was provided an opportunity to ask questions and all were answered. The patient agreed with the plan and demonstrated an  understanding of the instructions.  A copy of instructions were sent to the patient via MyChart unless otherwise noted below.   The patient was advised to call back or seek an in-person evaluation if the symptoms worsen or if the condition fails to improve as anticipated.    Jon CHRISTELLA Belt, NP

## 2023-12-11 NOTE — Patient Instructions (Signed)
 Kelly Kelly, thank you for joining Jon CHRISTELLA Belt, NP for today's virtual visit.  While this provider is not your primary care provider (PCP), if your PCP is located in our provider database this encounter information will be shared with them immediately following your visit.   A [facility] MyChart account gives you access to today's visit and all your visits, tests, and labs performed at [facility]  click here if you don't have a [facility] MyChart account or go to [email or web address]  Consent: (Patient) Kelly Kelly provided verbal consent for this virtual visit at the beginning of the encounter.  Current Medications:  Current Outpatient Medications:    lidocaine  (XYLOCAINE ) 2 % solution, Use as directed 15 mLs in the mouth or throat as needed for mouth pain., Disp: 100 mL, Rfl: 0   predniSONE  (STERAPRED UNI-PAK 21 TAB) 10 MG (21) TBPK tablet, Take 6 tabs on day 1; take 5 tabs day 2; take 4 tabs day 3; take 3 tabs day 4; take 2 tabs day 5; take 1 tab day 6, Disp: 21 tablet, Rfl: 0   amphetamine-dextroamphetamine (ADDERALL XR) 15 MG 24 hr capsule, Take 15 mg by mouth every morning., Disp: , Rfl:    ferrous sulfate  325 (65 FE) MG tablet, Take 325 mg by mouth daily with breakfast., Disp: , Rfl:    OVER THE COUNTER MEDICATION, Use as directed 1 tablet in the mouth or throat daily. Womens Probiotic, Disp: , Rfl:    sertraline  (ZOLOFT ) 50 MG tablet, Take 1.5 tablets (75 mg total) by mouth daily., Disp: 45 tablet, Rfl: 0   Vitamin D , Ergocalciferol , (DRISDOL ) 1.25 MG (50000 UNIT) CAPS capsule, Take 1 capsule (50,000 Units total) by mouth every 7 (seven) days., Disp: 4 capsule, Rfl: 0   Medications ordered in this encounter:  Meds ordered this encounter  Medications   predniSONE  (STERAPRED UNI-PAK 21 TAB) 10 MG (21) TBPK tablet    Sig: Take 6 tabs on day 1; take 5 tabs day 2; take 4 tabs day 3; take 3 tabs day 4; take 2 tabs day 5; take 1 tab day 6    Dispense:   21 tablet    Refill:  0   lidocaine  (XYLOCAINE ) 2 % solution    Sig: Use as directed 15 mLs in the mouth or throat as needed for mouth pain.    Dispense:  100 mL    Refill:  0     *If you need refills on other medications prior to your next appointment, please contact your pharmacy*  Follow-Up: Call back or seek an in-person evaluation if the symptoms worsen or if the condition fails to improve as anticipated.  [facility] [phone]  Other Instructions  Continue over the counter cold medicine.   If your throat pain continues, please get checked in person.    If you have been instructed to have an in-person evaluation today at a local Urgent Care facility, please use the link below. It will take you to a list of all of our available [facility], including address, phone number and hours of operation. Please do not delay care.  [facility]  If you or a family member do not have a primary care provider, use the link below to schedule a visit and establish care. When you choose a  primary care physician or advanced practice provider, you gain a long-term partner in health. Find a Primary Care Provider  Learn more about  [facility]'s in-office and virtual care options: [facility] - Get Care Now

## 2023-12-24 DIAGNOSIS — F411 Generalized anxiety disorder: Secondary | ICD-10-CM | POA: Diagnosis not present

## 2023-12-24 DIAGNOSIS — F902 Attention-deficit hyperactivity disorder, combined type: Secondary | ICD-10-CM | POA: Diagnosis not present
